# Patient Record
Sex: Male | Born: 1955 | Race: White | Hispanic: No | Smoking: Current some day smoker
Health system: Southern US, Community
[De-identification: ages and names within clinical notes are randomized; demographics above are authoritative.]

## PROBLEM LIST (undated history)

## (undated) DIAGNOSIS — I10 Essential (primary) hypertension: Secondary | ICD-10-CM

## (undated) DIAGNOSIS — T7840XA Allergy, unspecified, initial encounter: Secondary | ICD-10-CM

## (undated) HISTORY — PX: NASAL SEPTUM SURGERY: SHX37

## (undated) HISTORY — PX: TONSILLECTOMY: SUR1361

---

## 1998-05-22 ENCOUNTER — Other Ambulatory Visit: Admission: RE | Admit: 1998-05-22 | Discharge: 1998-05-22 | Payer: Self-pay | Admitting: Gastroenterology

## 2000-10-16 ENCOUNTER — Emergency Department (HOSPITAL_COMMUNITY): Admission: EM | Admit: 2000-10-16 | Discharge: 2000-10-17 | Payer: Self-pay

## 2013-10-11 ENCOUNTER — Observation Stay (HOSPITAL_COMMUNITY)
Admission: EM | Admit: 2013-10-11 | Discharge: 2013-10-13 | Disposition: A | Discharge status: Home / Self Care | Payer: BC Managed Care – PPO | Attending: Internal Medicine | Admitting: Internal Medicine

## 2013-10-11 ENCOUNTER — Emergency Department (HOSPITAL_COMMUNITY): Payer: BC Managed Care – PPO

## 2013-10-11 ENCOUNTER — Encounter (HOSPITAL_COMMUNITY): Payer: Self-pay | Admitting: Emergency Medicine

## 2013-10-11 DIAGNOSIS — H5500 Unspecified nystagmus: Secondary | ICD-10-CM | POA: Diagnosis present

## 2013-10-11 DIAGNOSIS — R42 Dizziness and giddiness: Principal | ICD-10-CM | POA: Diagnosis present

## 2013-10-11 DIAGNOSIS — R262 Difficulty in walking, not elsewhere classified: Secondary | ICD-10-CM | POA: Diagnosis present

## 2013-10-11 DIAGNOSIS — R112 Nausea with vomiting, unspecified: Secondary | ICD-10-CM | POA: Diagnosis present

## 2013-10-11 DIAGNOSIS — Z880 Allergy status to penicillin: Secondary | ICD-10-CM | POA: Insufficient documentation

## 2013-10-11 HISTORY — DX: Allergy, unspecified, initial encounter: T78.40XA

## 2013-10-11 HISTORY — DX: Essential (primary) hypertension: I10

## 2013-10-11 LAB — COMPREHENSIVE METABOLIC PANEL
ALT: 15 U/L (ref 0–53)
AST: 16 U/L (ref 0–37)
Albumin: 4.6 g/dL (ref 3.5–5.2)
Alkaline Phosphatase: 54 U/L (ref 39–117)
Anion gap: 16 — ABNORMAL HIGH (ref 5–15)
BUN: 14 mg/dL (ref 6–23)
CO2: 23 mEq/L (ref 19–32)
Calcium: 9.6 mg/dL (ref 8.4–10.5)
Chloride: 101 mEq/L (ref 96–112)
Creatinine, Ser: 0.81 mg/dL (ref 0.50–1.35)
GFR calc non Af Amer: >90 mL/min (ref >90)
Glucose, Bld: 111 mg/dL — ABNORMAL HIGH (ref 70–99)
POTASSIUM: 3.5 meq/L — AB (ref 3.7–5.3)
SODIUM: 140 meq/L (ref 137–147)
TOTAL PROTEIN: 7.7 g/dL (ref 6.0–8.3)
Total Bilirubin: 0.6 mg/dL (ref 0.3–1.2)

## 2013-10-11 LAB — CBC
HCT: 48.7 % (ref 39.0–52.0)
Hemoglobin: 16.7 g/dL (ref 13.0–17.0)
MCH: 30.6 pg (ref 26.0–34.0)
MCHC: 34.3 g/dL (ref 30.0–36.0)
MCV: 89.4 fL (ref 78.0–100.0)
PLATELETS: 150 10*3/uL (ref 150–400)
RBC: 5.45 MIL/uL (ref 4.22–5.81)
RDW: 14.1 % (ref 11.5–15.5)
WBC: 13.2 10*3/uL — ABNORMAL HIGH (ref 4.0–10.5)

## 2013-10-11 LAB — I-STAT TROPONIN, ED
Troponin i, poc: 0 ng/mL (ref 0.00–0.08)
Troponin i, poc: 0 ng/mL (ref 0.00–0.08)

## 2013-10-11 LAB — LIPASE, BLOOD: Lipase: 33 U/L (ref 11–59)

## 2013-10-11 MED ORDER — DIAZEPAM 5 MG PO TABS
5.0000 mg | ORAL_TABLET | Freq: Once | ORAL | Status: AC
  Administered 2013-10-11: 5 mg via ORAL
  Filled 2013-10-11: qty 1

## 2013-10-11 MED ORDER — ONDANSETRON HCL 4 MG/2ML IJ SOLN
INTRAMUSCULAR | Status: AC
  Filled 2013-10-11: qty 2

## 2013-10-11 MED ORDER — ONDANSETRON HCL 4 MG/2ML IJ SOLN
4.0000 mg | Freq: Once | INTRAMUSCULAR | Status: AC
  Administered 2013-10-11: 4 mg via INTRAVENOUS

## 2013-10-11 MED ORDER — SODIUM CHLORIDE 0.9 % IV BOLUS (SEPSIS)
1000.0000 mL | Freq: Once | INTRAVENOUS | Status: AC
  Administered 2013-10-11: 1000 mL via INTRAVENOUS

## 2013-10-11 MED ORDER — MECLIZINE HCL 25 MG PO TABS
25.0000 mg | ORAL_TABLET | Freq: Once | ORAL | Status: AC
  Administered 2013-10-11: 25 mg via ORAL
  Filled 2013-10-11: qty 1

## 2013-10-11 NOTE — ED Notes (Signed)
Initial Contact - pt A+Ox4, resting on stretcher, reports episode dizziness last night while at rest which self resolved and second episode dizziness approx 1630 today, unresolved.  Pt reports dizziness worse with position changes, denies hx of similar.  Pt reports mild nausea, no vomiting.  Reports generalized weakness.  Pt denies CP/SOB.  Speaking full/clear sentences, rr even/un-lab, lsctab.  Abd s/nt/nd.  Skin PWD.  MAEI.  NAD.

## 2013-10-11 NOTE — ED Notes (Signed)
Pt to radiology.

## 2013-10-11 NOTE — ED Provider Notes (Signed)
CSN: [id number]     Arrival date & time 10/11/13  1817 History   First MD Initiated Contact with Patient 10/11/13 1825     Chief Complaint  Patient presents with  . Dizziness     (Consider location/radiation/quality/duration/timing/severity/associated sxs/prior Treatment) Patient is a 58 y.o. male presenting with dizziness. The history is provided by the patient.  Dizziness Quality:  Lightheadedness Severity:  Moderate Onset quality:  Gradual Timing:  Intermittent Progression:  Worsening Chronicity:  New Context: physical activity (walking)   Relieved by:  Nothing Worsened by:  Nothing tried Ineffective treatments:  None tried Associated symptoms: no blood in stool, no chest pain, no shortness of breath and no vomiting     History reviewed. No pertinent past medical history. History reviewed. No pertinent past surgical history. No family history on file. History  Substance Use Topics  . Smoking status: Not on file  . Smokeless tobacco: Not on file  . Alcohol Use: Not on file    Review of Systems  Constitutional: Negative for fever.  Respiratory: Negative for cough and shortness of breath.   Cardiovascular: Negative for chest pain.  Gastrointestinal: Negative for vomiting, abdominal pain and blood in stool.  Neurological: Positive for dizziness.  All other systems reviewed and are negative.     Allergies  Penicillins  Home Medications   Prior to Admission medications   Not on File   BP 160/88  Pulse 58  Temp(Src) 97.7 F (36.5 C) (Oral)  Resp 16  SpO2 100% Physical Exam  Nursing note and vitals reviewed. Constitutional: He is oriented to person, place, and time. He appears well-developed and well-nourished. No distress.  HENT:  Head: Normocephalic and atraumatic.  Mouth/Throat: Oropharynx is clear and moist. No oropharyngeal exudate.  Eyes: EOM are normal. Pupils are equal, round, and reactive to light.  Neck: Normal range of motion. Neck supple.   Cardiovascular: Normal rate and regular rhythm.  Exam reveals no friction rub.   No murmur heard. Pulmonary/Chest: Effort normal and breath sounds normal. No respiratory distress. He has no wheezes. He has no rales.  Abdominal: Soft. He exhibits no distension. There is no tenderness. There is no rebound.  Musculoskeletal: Normal range of motion. He exhibits no edema.  Neurological: He is alert and oriented to person, place, and time. No cranial nerve deficit. He exhibits normal muscle tone. Coordination normal.  Skin: No rash noted. He is not diaphoretic.    ED Course  Procedures (including critical care time) Labs Review Labs Reviewed  CBC  COMPREHENSIVE METABOLIC PANEL  LIPASE, BLOOD  I-STAT TROPOININ, ED    Imaging Review Dg Chest 1 View  10/11/2013   CLINICAL DATA:  dizzy  EXAM: CHEST - 1 VIEW  COMPARISON:  None.  FINDINGS: The heart size and mediastinal contours are within normal limits. Both lungs are clear. Left lateral costophrenic angle excluded. Old healed left clavicle fracture.  IMPRESSION: No active disease.   Electronically Signed   By: Arne Cleveland M.D.   On: 10/11/2013 19:43   Ct Head Wo Contrast  10/11/2013   CLINICAL DATA:  DIZZINESS  EXAM: CT HEAD WITHOUT CONTRAST  TECHNIQUE: Contiguous axial images were obtained from the base of the skull through the vertex without intravenous contrast.  COMPARISON:  None.  FINDINGS: There is no evidence of acute intracranial hemorrhage, brain edema, mass lesion, acute infarction, mass effect, or midline shift. Acute infarct may be inapparent on noncontrast CT. No other intra-axial abnormalities are seen, and the ventricles and sulci  are within normal limits in size and symmetry. No abnormal extra-axial fluid collections or masses are identified. No significant calvarial abnormality.  IMPRESSION: Negative for bleed or other acute intracranial process.   Electronically Signed   By: Arne Cleveland M.D.   On: 10/11/2013 19:45   Mr  Brain Wo Contrast  10/11/2013   CLINICAL DATA:  New onset dizziness and nausea.  General weakness.  EXAM: MRI HEAD WITHOUT CONTRAST  TECHNIQUE: Multiplanar, multiecho pulse sequences of the brain and surrounding structures were obtained without intravenous contrast.  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  There is no acute infarct. Ventricles and sulci are normal for age. There is no evidence of intracranial hemorrhage, mass, midline shift, or extra-axial fluid collection. No brain parenchymal signal abnormality is identified.  Orbits are unremarkable. Paranasal sinuses and mastoid air cells are clear. Major intracranial vascular flow voids are preserved. Calvarium and scalp soft tissues are unremarkable.  IMPRESSION: Unremarkable appearance of the brain for age. No evidence of acute intracranial abnormality.   Electronically Signed   By: Logan Bores   On: 10/11/2013 21:28     EKG Interpretation   Date/Time:  Friday October 11 2013 18:22:11 EDT Ventricular Rate:  58 PR Interval:  153 QRS Duration: 95 QT Interval:  440 QTC Calculation: 432 R Axis:   52 Text Interpretation:  Sinus rhythm Left atrial enlargement No prior EKG  Confirmed by Mingo Amber  MD, Elya Tarquinio ([id number]) on 10/11/2013 7:00:10 PM      MDM   Final diagnoses:  Dizziness    58 year old male presents with dizziness. Happened last night for about a minute. It was self-limited after about a minute. Happened while he is walking around. He had not just sat up. Happened again today while working, was walking around. He became diaphoretic and began having nausea. Dizziness described as wooziness, lightheadedness. No room spinning sensation. He also felt unsteady on his feet. Dizziness has no alleviating or exacerbating factors.  On exam, vitals ok. He had an abrupt episode of N/V while we were talking. R beating nystagmus on exam. Denies diplopia, but states some mildly blurry  vision. Normal strength and sensation. Unable to walk due to his N/V. Will MR for possible stroke. Will also send other labs. EKG ok. Zofran and fluids given. MR ok. Labs ok. Serial troponins ok. Persistent dizziness with movement, given valium and meclizine. No change with meclizine/valium. Persistently weak and dizzy here. Will admit for further observation.  Osvaldo Shipper, MD 10/12/13 [id number]

## 2013-10-11 NOTE — ED Notes (Signed)
Bed: EC95 Expected date:  Expected time:  Means of arrival:  Comments: EMS-dizzy

## 2013-10-11 NOTE — H&P (Signed)
Mark Frank is an 58 y.o. male.   Chief Complaint: Dizziness, nausea and vomiting, and diaphoresis HPI: pt is a 58 yr old man who states that the problem of dizziness started on the evening on the 23rd while he was walking.  His daughter states that he took a slight blow to the head just before he started feeling dizzy, but the patient denies that the two are related.  He states that the dizziness was slight at that time and had completely resolved by the time he went to work the next morning.  At 1600 he was at work in his office.  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  He states that when he walked in there he was stumbling and couldn't get his balance.  When he got back to his desk and sat down he put his head down. He denies that the room was spinning, but he does say that he was nauseated and that he broke into a sweat.  He had his secretary call EMS and bring him to the ED.  He had emesis x 1 when he arrived in the ED.  Now the patient states that his symptoms are minimized by lying flat, but that he feels very woozy when he sits up.  He states that his symptoms are unchanged by moving his head from side to side.    History reviewed. No pertinent past medical history. Pt denies any PMHx of cardiac, respiratory, endocrine, GI, renal, or hepatic medical problems. He states that he sees a PCP on a regular basis and has lost a fair amount of weight intentionally in the last year. He was to discuss potentially initiating treatment for hypertension on his next visit.  History reviewed. No pertinent past surgical history. Denies any surgical history. No family history on file.Father with CAD and colon cancer. No family history of DM. Social History:  has no tobacco, alcohol, and drug history on file.  Allergies:  Allergies  Allergen Reactions  . Penicillins Hives and Rash     (Not in a hospital admission)  Results for orders placed during the hospital encounter  of 10/11/13 (from the past 48 hour(s))  CBC     Status: Abnormal   Collection Time    10/11/13  7:03 PM      Result Value Ref Range   WBC 13.2 (*) 4.0 - 10.5 K/uL   RBC 5.45  4.22 - 5.81 MIL/uL   Hemoglobin 16.7  13.0 - 17.0 g/dL   HCT 48.7  39.0 - 52.0 %   MCV 89.4  78.0 - 100.0 fL   MCH 30.6  26.0 - 34.0 pg   MCHC 34.3  30.0 - 36.0 g/dL   RDW 14.1  11.5 - 15.5 %   Platelets 150  150 - 400 K/uL  COMPREHENSIVE METABOLIC PANEL     Status: Abnormal   Collection Time    10/11/13  7:03 PM      Result Value Ref Range   Sodium 140  137 - 147 mEq/L   Potassium 3.5 (*) 3.7 - 5.3 mEq/L   Chloride 101  96 - 112 mEq/L   CO2 23  19 - 32 mEq/L   Glucose, Bld 111 (*) 70 - 99 mg/dL   BUN 14  6 - 23 mg/dL   Creatinine, Ser 0.81  0.50 - 1.35 mg/dL   Calcium 9.6  8.4 - 10.5 mg/dL   Total Protein 7.7  6.0 - 8.3  g/dL   Albumin 4.6  3.5 - 5.2 g/dL   AST 16  0 - 37 U/L   ALT 15  0 - 53 U/L   Alkaline Phosphatase 54  39 - 117 U/L   Total Bilirubin 0.6  0.3 - 1.2 mg/dL   GFR calc non Af Amer >90  >90 mL/min   GFR calc Af Amer >90  >90 mL/min   Comment: (NOTE)     The eGFR has been calculated using the CKD EPI equation.     This calculation has not been validated in all clinical situations.     eGFR's persistently <90 mL/min signify possible Chronic Kidney     Disease.   Anion gap 16 (*) 5 - 15  LIPASE, BLOOD     Status: None   Collection Time    10/11/13  7:03 PM      Result Value Ref Range   Lipase 33  11 - 59 U/L  I-STAT TROPOININ, ED     Status: None   Collection Time    10/11/13  7:12 PM      Result Value Ref Range   Troponin i, poc 0.00  0.00 - 0.08 ng/mL   Comment 3            Comment: Due to the release kinetics of cTnI,     a negative result within the first hours     of the onset of symptoms does not rule out     myocardial infarction with certainty.     If myocardial infarction is still suspected,     repeat the test at appropriate intervals.  Randolm Idol, ED      Status: None   Collection Time    10/11/13 11:11 PM      Result Value Ref Range   Troponin i, poc 0.00  0.00 - 0.08 ng/mL   Comment 3            Comment: Due to the release kinetics of cTnI,     a negative result within the first hours     of the onset of symptoms does not rule out     myocardial infarction with certainty.     If myocardial infarction is still suspected,     repeat the test at appropriate intervals.   Dg Chest 1 View  10/11/2013   CLINICAL DATA:  dizzy  EXAM: CHEST - 1 VIEW  COMPARISON:  None.  FINDINGS: The heart size and mediastinal contours are within normal limits. Both lungs are clear. Left lateral costophrenic angle excluded. Old healed left clavicle fracture.  IMPRESSION: No active disease.   Electronically Signed   By: Arne Cleveland M.D.   On: 10/11/2013 19:43   Ct Head Wo Contrast  10/11/2013   CLINICAL DATA:  DIZZINESS  EXAM: CT HEAD WITHOUT CONTRAST  TECHNIQUE: Contiguous axial images were obtained from the base of the skull through the vertex without intravenous contrast.  COMPARISON:  None.  FINDINGS: There is no evidence of acute intracranial hemorrhage, brain edema, mass lesion, acute infarction, mass effect, or midline shift. Acute infarct may be inapparent on noncontrast CT. No other intra-axial abnormalities are seen, and the ventricles and sulci are within normal limits in size and symmetry. No abnormal extra-axial fluid collections or masses are identified. No significant calvarial abnormality.  IMPRESSION: Negative for bleed or other acute intracranial process.   Electronically Signed   By: Arne Cleveland M.D.   On: 10/11/2013 19:45  Mr Brain Wo Contrast  10/11/2013   CLINICAL DATA:  New onset dizziness and nausea.  General weakness.  EXAM: MRI HEAD WITHOUT CONTRAST  TECHNIQUE: Multiplanar, multiecho pulse sequences of the brain and surrounding structures were obtained without intravenous contrast.  COMPARISON:  Head CT 10/11/2013  FINDINGS: Small left  frontal scalp lipoma is incidentally noted.  There is no acute infarct. Ventricles and sulci are normal for age. There is no evidence of intracranial hemorrhage, mass, midline shift, or extra-axial fluid collection. No brain parenchymal signal abnormality is identified.  Orbits are unremarkable. Paranasal sinuses and mastoid air cells are clear. Major intracranial vascular flow voids are preserved. Calvarium and scalp soft tissues are unremarkable.  IMPRESSION: Unremarkable appearance of the brain for age. No evidence of acute intracranial abnormality.   Electronically Signed   By: Logan Bores   On: 10/11/2013 21:28    Review of Systems  Constitutional: Positive for weight loss and diaphoresis. Negative for fever.       Intentional weight loss.  HENT: Positive for congestion. Negative for ear discharge, ear pain, hearing loss, sore throat and tinnitus.        Chronic nasal congestion.  Eyes: Negative for blurred vision, double vision and redness.  Respiratory: Negative for cough, hemoptysis, sputum production, shortness of breath, wheezing and stridor.   Cardiovascular: Negative for chest pain, palpitations, orthopnea, claudication, leg swelling and PND.  Gastrointestinal: Positive for nausea and vomiting. Negative for heartburn, abdominal pain, diarrhea and constipation.  Genitourinary: Negative for dysuria, frequency and flank pain.  Musculoskeletal: Negative for back pain, falls, joint pain, myalgias and neck pain.  Neurological: Positive for dizziness. Negative for tingling, tremors, sensory change, speech change, focal weakness, seizures, loss of consciousness, weakness and headaches.  Endo/Heme/Allergies: Negative for environmental allergies. Does not bruise/bleed easily.  Psychiatric/Behavioral: Negative for depression, memory loss and substance abuse. The patient does not have insomnia.     Blood pressure 155/107, pulse 58, temperature 97.7 F (36.5 C), temperature source Oral, resp.  rate 20, SpO2 100.00%. Physical Exam  Constitutional: He is oriented to person, place, and time. He appears well-developed and well-nourished.  Pt is awake, alert, and oriented x 3.  He states that he did have severe diaphoresis earlier in the afternoon in his office.  HENT:  Head: Normocephalic and atraumatic.  Mouth/Throat: Oropharynx is clear and moist. No oropharyngeal exudate.  Eyes: EOM are normal. Pupils are equal, round, and reactive to light. Right eye exhibits no discharge. Left eye exhibits no discharge. No scleral icterus.  Positive for left beat nystagmus while lying flat with minimal symptoms.  Neck: No JVD present. No tracheal deviation present. No thyromegaly present.  Cardiovascular: Normal rate, regular rhythm and normal heart sounds.  Exam reveals no gallop and no friction rub.   No murmur heard. Respiratory: Effort normal and breath sounds normal. No respiratory distress. He has no rales. He exhibits no tenderness.  GI: He exhibits no distension and no mass. There is no tenderness. There is no rebound and no guarding.  Musculoskeletal: Normal range of motion. He exhibits no edema and no tenderness.  Lymphadenopathy:    He has no cervical adenopathy.  Neurological: He is alert and oriented to person, place, and time. He has normal reflexes. He displays normal reflexes. No cranial nerve deficit. He exhibits normal muscle tone.  Skin: Skin is warm and dry. No erythema. No pallor.  Psychiatric: He has a normal mood and affect. Judgment and thought content normal.  Assessment/Plan 1.Dizziness. Stumbling gate, "wooziness", feeling as if he will fall over. Worse with sitting up, standing, or walking. MRI of the head is negative for acute changes. Pt will be admitted to observation status.  He will have a neurological consult in the morning as well as a PT consult.  It is possible that the patient is suffering from benign positional vertigo, but his symptoms and the continual  presence of nystagmus are not typical. I will given him prn valium for his symptoms for now. 2. Hypertension - will start pt on lisinopril 3. Nausea and vomiting - antiemetics. 4. Pt is unable to walk due to dizziness. - PT eval and treat.   Tran Randle 10/11/2013, 11:53 PM

## 2013-10-11 NOTE — ED Notes (Signed)
Pt states he doesn't have any pain only woozie and dizziness when he sits up.

## 2013-10-11 NOTE — ED Notes (Signed)
Pt actively vomiting, Dr. Mingo Amber at bedside, verbal orders rec'd and pt med per [facility].

## 2013-10-11 NOTE — ED Notes (Signed)
PER EMS - pt c/o dizziness intermittently over the last week, pt reports stressors at work, mild nausea, denies other complaints.  A+Ox4.  Skin PWD.  -orthos.

## 2013-10-12 ENCOUNTER — Encounter (HOSPITAL_COMMUNITY): Payer: Self-pay | Admitting: *Deleted

## 2013-10-12 DIAGNOSIS — R262 Difficulty in walking, not elsewhere classified: Secondary | ICD-10-CM | POA: Diagnosis present

## 2013-10-12 DIAGNOSIS — R112 Nausea with vomiting, unspecified: Secondary | ICD-10-CM | POA: Diagnosis present

## 2013-10-12 DIAGNOSIS — R42 Dizziness and giddiness: Secondary | ICD-10-CM | POA: Diagnosis present

## 2013-10-12 DIAGNOSIS — H5500 Unspecified nystagmus: Secondary | ICD-10-CM | POA: Diagnosis present

## 2013-10-12 LAB — CBC
HEMATOCRIT: 47.9 % (ref 39.0–52.0)
HEMOGLOBIN: 16.2 g/dL (ref 13.0–17.0)
MCH: 30.6 pg (ref 26.0–34.0)
MCHC: 33.8 g/dL (ref 30.0–36.0)
MCV: 90.4 fL (ref 78.0–100.0)
Platelets: 154 10*3/uL (ref 150–400)
RBC: 5.3 MIL/uL (ref 4.22–5.81)
RDW: 14 % (ref 11.5–15.5)
WBC: 10.2 10*3/uL (ref 4.0–10.5)

## 2013-10-12 LAB — COMPREHENSIVE METABOLIC PANEL
ALT: 14 U/L (ref 0–53)
AST: 14 U/L (ref 0–37)
Albumin: 4 g/dL (ref 3.5–5.2)
Alkaline Phosphatase: 51 U/L (ref 39–117)
Anion gap: 12 (ref 5–15)
BILIRUBIN TOTAL: 0.7 mg/dL (ref 0.3–1.2)
BUN: 12 mg/dL (ref 6–23)
CALCIUM: 9.2 mg/dL (ref 8.4–10.5)
CHLORIDE: 103 meq/L (ref 96–112)
CO2: 25 meq/L (ref 19–32)
CREATININE: 0.83 mg/dL (ref 0.50–1.35)
GFR calc non Af Amer: >90 mL/min (ref >90)
Glucose, Bld: 126 mg/dL — ABNORMAL HIGH (ref 70–99)
Potassium: 3.8 mEq/L (ref 3.7–5.3)
Sodium: 140 mEq/L (ref 137–147)
Total Protein: 6.8 g/dL (ref 6.0–8.3)

## 2013-10-12 MED ORDER — SODIUM CHLORIDE 0.9 % IV SOLN: 250.0000 mL | INTRAVENOUS | Status: DC | PRN

## 2013-10-12 MED ORDER — LISINOPRIL 10 MG PO TABS
10.0000 mg | ORAL_TABLET | Freq: Every day | ORAL | Status: DC
  Administered 2013-10-12 – 2013-10-13 (×2): 10 mg via ORAL
  Filled 2013-10-12 (×2): qty 1

## 2013-10-12 MED ORDER — ACETAMINOPHEN 650 MG RE SUPP: 650.0000 mg | Freq: Four times a day (QID) | RECTAL | Status: DC | PRN

## 2013-10-12 MED ORDER — DIAZEPAM 2 MG PO TABS: 2.0000 mg | ORAL_TABLET | Freq: Four times a day (QID) | ORAL | Status: DC | PRN

## 2013-10-12 MED ORDER — LORATADINE 10 MG PO TABS
10.0000 mg | ORAL_TABLET | Freq: Every day | ORAL | Status: DC
  Administered 2013-10-12 – 2013-10-13 (×2): 10 mg via ORAL
  Filled 2013-10-12 (×2): qty 1

## 2013-10-12 MED ORDER — OMEGA-3-ACID ETHYL ESTERS 1 G PO CAPS
2.0000 g | ORAL_CAPSULE | Freq: Every day | ORAL | Status: DC
  Administered 2013-10-12 – 2013-10-13 (×2): 2 g via ORAL
  Filled 2013-10-12 (×2): qty 2

## 2013-10-12 MED ORDER — ASPIRIN 81 MG PO CHEW
81.0000 mg | CHEWABLE_TABLET | Freq: Every day | ORAL | Status: DC
  Administered 2013-10-12 – 2013-10-13 (×2): 81 mg via ORAL
  Filled 2013-10-12 (×2): qty 1

## 2013-10-12 MED ORDER — ACETAMINOPHEN 325 MG PO TABS: 650.0000 mg | ORAL_TABLET | Freq: Four times a day (QID) | ORAL | Status: DC | PRN

## 2013-10-12 MED ORDER — ENOXAPARIN SODIUM 40 MG/0.4ML ~~LOC~~ SOLN
40.0000 mg | SUBCUTANEOUS | Status: DC
  Administered 2013-10-12: 40 mg via SUBCUTANEOUS
  Filled 2013-10-12 (×2): qty 0.4

## 2013-10-12 MED ORDER — MECLIZINE HCL 25 MG PO TABS
25.0000 mg | ORAL_TABLET | Freq: Three times a day (TID) | ORAL | Status: DC | PRN
  Administered 2013-10-13: 25 mg via ORAL
  Filled 2013-10-12 (×2): qty 1

## 2013-10-12 MED ORDER — SODIUM CHLORIDE 0.9 % IJ SOLN: 3.0000 mL | INTRAMUSCULAR | Status: DC | PRN

## 2013-10-12 MED ORDER — SODIUM CHLORIDE 0.9 % IJ SOLN
3.0000 mL | Freq: Two times a day (BID) | INTRAMUSCULAR | Status: DC
  Administered 2013-10-12 (×2): 3 mL via INTRAVENOUS

## 2013-10-12 NOTE — Plan of Care (Signed)
Problem: Phase I Progression Outcomes Goal: Hemodynamically stable Outcome: Progressing To start antihypertensives in am

## 2013-10-12 NOTE — Progress Notes (Signed)
Patient admitted from ED with dizziness to room 1337. Oriented to room and unit. Patient remains dizzy with nausea sensation when rising from lying position to sit or stand. Bed alarm on. Patient instructed to call for assist.

## 2013-10-12 NOTE — Plan of Care (Signed)
Problem: Phase I Progression Outcomes Goal: Pain controlled with appropriate interventions Outcome: Not Applicable Date Met:  70/78/67 Denies pain

## 2013-10-12 NOTE — Progress Notes (Signed)
Report obtained from Vickii Penna in ED.

## 2013-10-12 NOTE — Evaluation (Addendum)
Physical Therapy Evaluation Patient Details Name: Mark Frank MRN: [id number] DOB: 07/18/1955 Today's Date: 10/12/2013   History of Present Illness  Patient is a 58 yo male admitted 10/11/13 with dizziness, N/V, and imbalance.  PMH: HTN, Allergies  Clinical Impression  Patient presents with problems listed below.  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  Provided patient with exercise program.  Also instructed patient on compensation technique for imbalance during gait.  Recommend f/u OP PT for vestibular rehab at discharge (patient will need a prescription for this).  Will continue to work with patient while inpatient as well.    Follow Up Recommendations Outpatient PT for Vestibular Rehab;Supervision - Intermittent    Equipment Recommendations  None recommended by PT    Recommendations for Other Services       Precautions / Restrictions Precautions Precautions: Fall Restrictions Weight Bearing Restrictions: No      Mobility  Bed Mobility Overal bed mobility: Independent                Transfers Overall transfer level: Needs assistance Equipment used: None Transfers: Sit to/from Stand Sit to Stand: Supervision         General transfer comment: Supervision for safety/balance only.  Ambulation/Gait Ambulation/Gait assistance: Min guard Ambulation Distance (Feet): 250 Feet Assistive device: None Gait Pattern/deviations: Step-through pattern;Staggering left;Staggering right;Drifts right/left Gait velocity: WFL Gait velocity interpretation: at or above normal speed for age/gender General Gait Details: Patient with staggering gait to Rt and Lt.  Assist to maintain balance.  Instructed patient to use compensation technique of focusing on a target during gait.  Did note some improvement with less loss of balance.  Stairs            Wheelchair Mobility    Modified Rankin  (Stroke Patients Only)       Balance Overall balance assessment: Needs assistance         Standing balance support: No upper extremity supported;During functional activity Standing balance-Leahy Scale: Fair               High level balance activites: Direction changes;Turns;Sudden stops;Head turns High Level Balance Comments: Noted decrease in balance with high level balance activities.             Pertinent Vitals/Pain     Home Living Family/patient expects to be discharged to:: Private residence Living Arrangements: Alone Available Help at Discharge: Family;Friend(s);Available PRN/intermittently (Daughters, girlfriend) Type of Home: House         Home Equipment: None      Prior Function Level of Independence: Independent         Comments: Very active     Hand Dominance        Extremity/Trunk Assessment   Upper Extremity Assessment: Overall WFL for tasks assessed           Lower Extremity Assessment: Overall WFL for tasks assessed      Cervical / Trunk Assessment: Normal  Communication   Communication: No difficulties  Cognition Arousal/Alertness: Awake/alert Behavior During Therapy: WFL for tasks assessed/performed Overall Cognitive Status: Within Functional Limits for tasks assessed                      General Comments      Vestibular Eval: *BPPV testing:  Dix-Hallpike tested negative to Rt and Lt.  Roll test was negative to Rt and Lt. *Oculomotor:  Noted spontaneous right beating nystagmus, present in all directions  and stronger with right gaze.  Smooth pursuits and saccades appeared normal - difficulty testing with nystagmus present.  VOR tested normal  Noted positive head thrust to left. *Exercises:  Instructed patient to do x1 exercises in sitting both horizontally and vertically for up to 60 seconds, 3 times each.  Patient to complete these exercises 3x/day. *Compensation technique: Focus on target with mobility and  gait.  Instructed patient on using technique during turns.      Assessment/Plan    PT Assessment Patient needs continued PT services  PT Diagnosis Abnormality of gait   PT Problem List Decreased balance;Decreased mobility;Other (comment) (Dizziness)  PT Treatment Interventions Gait training;Functional mobility training;Therapeutic activities;Therapeutic exercise;Balance training;Patient/family education (Vestibular Rehab)   PT Goals (Current goals can be found in the Care Plan section) Acute Rehab PT Goals Patient Stated Goal: To be able to return home soon PT Goal Formulation: With patient Time For Goal Achievement: 10/19/13 Potential to Achieve Goals: Good    Frequency Min 4X/week   Barriers to discharge Decreased caregiver support Lives alone    Co-evaluation               End of Session   Activity Tolerance: Patient tolerated treatment well Patient left: in bed;with call bell/phone within reach Nurse Communication: Mobility status (Need for f/u OP PT)    Functional Assessment Tool Used: Clinical judgement Functional Limitation: Mobility: Walking and moving around Mobility: Walking and Moving Around Current Status ([id number]): At least 20 percent but less than 40 percent impaired, limited or restricted Mobility: Walking and Moving Around Goal Status [phone]): 0 percent impaired, limited or restricted    Time: 5638-7564 PT Time Calculation (min): 57 min   Charges:   PT Evaluation $Initial PT Evaluation Tier I: 1 Procedure PT Treatments $Gait Training: 8-22 mins $Therapeutic Activity: 23-37 mins   PT G Codes:   Functional Assessment Tool Used: Clinical judgement Functional Limitation: Mobility: Walking and moving around    Despina Pole 10/12/2013, 6:19 PM Carita Pian. Sanjuana Kava, [place] Pager [phone]

## 2013-10-12 NOTE — Consult Note (Signed)
Reason for Consult: Dizziness Referring Physician: Dr Olen Pel  CC: Dizziness  HPI: Mark Frank is a very pleasant 58 y.o. male admitted to [facility] yesterday for evaluation of dizziness. Several days ago the patient was walking and talking with his daughter when he suddenly became very dizzy. He stopped walking and leaned against a car. The episode passed in about 45 seconds. Yesterday he was at work and around 4 PM he got up from his desk to speak with an Environmental consultant. He once again became very dizzy, off balance, and nauseated. He sat down in a chair and his coworker told him he was cold and clammy. He was also nauseated and later vomited. EMS was summoned and the patient was brought to the emergency department where he was evaluated. A CT scan of the head and an MRI with both negative for acute changes. An MRA was not performed. The patient felt fine lying on a stretcher with his eyes closed but each time he sat up or stood the dizziness returned. He was admitted for further evaluation.  The patient believes he may or may not have had some blurred vision associated with this episode. He was fairly anxious. He denied chest pain, shortness of breath, headache, or tachycardia. His blood pressure was elevated on admission with documented pressures of 155/107 and 153/91. Recently the patient has been concerned about his blood pressure running high and he had intended to speak with his personal physician about medical therapy.  The patient is very active and works out 3-4 times per week with a Physiological scientist. 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 Today he feels much better; however, he is still mildly dizzy when standing. He lives alone. He has been on aspirin 81 mg daily. Lisinopril is been added for hypertension.   Past Medical History  Diagnosis Date  .  Allergy   . Hypertension     Past Surgical History  Procedure Laterality Date  . Nasal septum surgery    . Tonsillectomy      Family History  Problem Relation Age of Onset  . Cancer - Colon Father    His father died at age 2. He had hypertension. His mother is alive at age 45 with multiple medical problems.  Social History:  reports that he has been smoking Cigarettes.  He has been smoking about 0.00 packs per day for the past 0 years. He does not have any smokeless tobacco history on file. He reports that he drinks alcohol. He reports that he does not use illicit drugs.  Allergies  Allergen Reactions  . Penicillins Hives and Rash    Medications:  Scheduled: . aspirin  81 mg Oral Daily  . enoxaparin (LOVENOX) injection  40 mg Subcutaneous Q24H  . lisinopril  10 mg Oral Daily  . loratadine  10 mg Oral Daily  . omega-3 acid ethyl esters  2 g Oral Daily  . sodium chloride  3 mL Intravenous Q12H    ROS: History obtained from the patient  General ROS: negative for - chills, fatigue, fever, night sweats, weight gain. Intentional 50 pound weight loss over the past 3 years through diet and exercise.  Psychological ROS: negative for - behavioral disorder, hallucinations, memory difficulties, mood swings or suicidal ideation. Positive for increased stress. Ophthalmic ROS: negative for - blurry vision, double vision,  eye pain or loss of vision ENT ROS: negative for - epistaxis, nasal discharge, oral lesions, sore throat. Positive for mild tinnitus and recent fitting for hearing aids. Allergy and Immunology ROS: negative for - hives or itchy/watery eyes Hematological and Lymphatic ROS: negative for - bleeding problems, bruising or swollen lymph nodes Endocrine ROS: negative for - galactorrhea, hair pattern changes, polydipsia/polyuria or temperature intolerance Respiratory ROS: negative for - cough, hemoptysis, shortness of breath or wheezing Cardiovascular ROS: negative for - chest  pain, dyspnea on exertion, edema or irregular heartbeat Gastrointestinal ROS: negative for - abdominal pain, diarrhea, hematemesis, nausea/vomiting or stool incontinence Genito-Urinary ROS: negative for - dysuria, hematuria, incontinence or urinary frequency/urgency Musculoskeletal ROS: negative for - joint swelling or muscular weakness Neurological ROS: as noted in HPI Dermatological ROS: negative for rash and skin lesion changes   Physical Examination: Blood pressure 147/90, pulse 69, temperature 98.3 F (36.8 C), temperature source Oral, resp. rate 18, height 5\' 11"  (1.803 m), weight 207 lb 9.6 oz (94.167 kg), SpO2 99.00%.  Neurologic Examination Mental Status: Alert, oriented, thought content appropriate.  Speech fluent without evidence of aphasia.  Able to follow 3 step commands without difficulty. Cranial Nerves: II: Discs flat bilaterally; Visual fields grossly normal, pupils equal, round, reactive to light and accommodation III,IV, VI: ptosis not present, extra-ocular motions intact bilaterally V,VII: smile symmetric, facial light touch sensation normal bilaterally VIII: hearing normal bilaterally IX,X: gag reflex present XI: bilateral shoulder shrug XII: midline tongue extension Motor: Right : Upper extremity   5/5    Left:     Upper extremity   5/5  Lower extremity   5/5     Lower extremity   5/5 Tone and bulk:normal tone throughout; no atrophy noted Sensory: Pinprick and light touch intact throughout, bilaterally Deep Tendon Reflexes: 2+ and symmetric throughout Plantars: Right: downgoing   Left: downgoing Cerebellar: normal finger-to-nose, normal rapid alternating movements and normal heel-to-shin test Gait: Mildly unsteady on his feet.   Laboratory Studies:   Basic Metabolic Panel:  Recent Labs Lab 10/11/13 1903 10/12/13 0411  NA 140 140  K 3.5* 3.8  CL 101 103  CO2 23 25  GLUCOSE 111* 126*  BUN 14 12  CREATININE 0.81 0.83  CALCIUM 9.6 9.2    Liver  Function Tests:  Recent Labs Lab 10/11/13 1903 10/12/13 0411  AST 16 14  ALT 15 14  ALKPHOS 54 51  BILITOT 0.6 0.7  PROT 7.7 6.8  ALBUMIN 4.6 4.0    Recent Labs Lab 10/11/13 1903  LIPASE 33   No results found for this basename: AMMONIA,  in the last 168 hours  CBC:  Recent Labs Lab 10/11/13 1903 10/12/13 0411  WBC 13.2* 10.2  HGB 16.7 16.2  HCT 48.7 47.9  MCV 89.4 90.4  PLT 150 154    Cardiac Enzymes: No results found for this basename: CKTOTAL, CKMB, CKMBINDEX, TROPONINI,  in the last 168 hours  BNP: No components found with this basename: POCBNP,   CBG: No results found for this basename: GLUCAP,  in the last 168 hours  Microbiology: No results found for this or any previous visit.  Coagulation Studies: No results found for this basename: LABPROT, INR,  in the last 72 hours  Urinalysis: No results found for this basename: COLORURINE, APPERANCEUR, LABSPEC, PHURINE, GLUCOSEU, HGBUR, BILIRUBINUR, KETONESUR, PROTEINUR, UROBILINOGEN, NITRITE, LEUKOCYTESUR,  in the last 168 hours  Lipid Panel:  No results found for this basename: chol, trig, hdl, cholhdl, vldl, ldlcalc  HgbA1C:  No results found for this basename: HGBA1C    Urine Drug Screen:   No results found for this basename: labopia, cocainscrnur, labbenz, amphetmu, thcu, labbarb    Alcohol Level: No results found for this basename: ETH,  in the last 168 hours  Other results: EKG: SR rate 58 beats per minute. Please refer to the formal reading for complete details.  Imaging:  Dg Chest 1 View 10/11/2013    No active disease.    Ct Head Wo Contrast 10/11/2013     Negative for bleed or other acute intracranial process.    Mr Brain Wo Contrast 10/11/2013     Unremarkable appearance of the brain for age. No evidence of acute intracranial abnormality.         Assessment/Plan:  58 year old man with likely benign positional vertigo. Patient has no focal deficits on examination and MRI  studies showed no signs of acute stroke or other posterior fossa pathology.   Recommendations: 1. Continue meclizine and diazepam as needed for management of vertigo symptomatically. 2. Physical therapy consultation for vestibular training and management. 3. No further neurodiagnostic studies are indicated.  Mikey Bussing PA-C [facility] Pager [phone] 10/12/2013, 12:56 PM  I personally participated in this patient's evaluation and management, including formulating above clinical impression and management recommendations.  Rush Farmer M.D. [facility] [phone]

## 2013-10-12 NOTE — Progress Notes (Addendum)
Patient ID: Mark Frank, male   DOB: 03/09/1956, 58 y.o.   MRN: [id number]  [facility] PROGRESS NOTE  Mark Frank QBV:[id number] DOB: 10-26-1955 DOA: 10/11/2013 PCP:  Melinda Crutch, MD  Brief narrative: 58 yr old man presents with dizziness started on the evening on the 23rd while he was walking. His daughter states that he took a slight blow to the head just before he started feeling dizzy, but the patient denies that the two are related. He states that the dizziness was slight at that time and had completely resolved by the time he went to work the next morning. At 1600 he was at work in his office. 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 He states that when he walked in there he was stumbling and couldn't get his balance. When he got back to his desk and sat down he put his head down. He denies that the room was spinning, but he does say that he was nauseated and that he broke into a sweat. He had his secretary call EMS and bring him to the ED. He had emesis x 1 when he arrived in the ED. Now the patient states that his symptoms are minimized by lying flat, but that he feels very woozy when he sits up. He states that his symptoms are unchanged by moving his head from side to side.   Active Problems:   Dizziness of unknown cause - ambulating in the room with me but feels still dizzy - will ask neurology for further input - PT/vestibular therapy - ambulate as pt able to tolerate - provide meclizine as needed  - avoid Valium, pt has been getting 2 mg valium and could be provoking dizziness   HTN - continue Lisinopril   Consultants:  Neurology   Procedures/Studies: Dg Chest 1 View  10/11/2013  No active disease.    Ct Head Wo Contrast  10/11/2013   Negative for bleed or other acute intracranial process.   Mr Brain Wo Contrast  10/11/2013 Unremarkable appearance of the brain for age. No evidence of acute intracranial abnormality.    Antibiotics:  None    Code Status: Full Family Communication: Pt at bedside Disposition Plan: Home when medically stable  HPI/Subjective: No events overnight.   Objective: Filed Vitals:   10/12/13 0211 10/12/13 0214 10/12/13 0217 10/12/13 0431  BP: 143/88 153/91 141/95 147/90  Pulse: 51 63 73 69  Temp:    98.3 F (36.8 C)  TempSrc:    Oral  Resp:    18  Height:      Weight:      SpO2:    99%    Intake/Output Summary (Last 24 hours) at 10/12/13 1109 Last data filed at 10/12/13 1026  Gross per 24 hour  Intake    113 ml  Output      0 ml  Net    113 ml    Exam:   General:  Pt is alert, follows commands appropriately, not in acute distress  Cardiovascular: Regular rate and rhythm, S1/S2, no murmurs, no rubs, no gallops  Respiratory: Clear to auscultation bilaterally, no wheezing, no crackles, no rhonchi  Abdomen: Soft, non tender, non distended, bowel sounds present, no guarding  Extremities: No edema, pulses DP and PT palpable bilaterally  Neuro: Grossly nonfocal  Data Reviewed: Basic Metabolic Panel:  Recent Labs Lab 10/11/13 1903 10/12/13 0411  NA 140 140  K 3.5* 3.8  CL 101 103  CO2 23  25  GLUCOSE 111* 126*  BUN 14 12  CREATININE 0.81 0.83  CALCIUM 9.6 9.2   Liver Function Tests:  Recent Labs Lab 10/11/13 1903 10/12/13 0411  AST 16 14  ALT 15 14  ALKPHOS 54 51  BILITOT 0.6 0.7  PROT 7.7 6.8  ALBUMIN 4.6 4.0    Recent Labs Lab 10/11/13 1903  LIPASE 33   CBC:  Recent Labs Lab 10/11/13 1903 10/12/13 0411  WBC 13.2* 10.2  HGB 16.7 16.2  HCT 48.7 47.9  MCV 89.4 90.4  PLT 150 154   Scheduled Meds: . aspirin  81 mg Oral Daily  . enoxaparin (LOVENOX) injection  40 mg Subcutaneous Q24H  . lisinopril  10 mg Oral Daily  . loratadine  10 mg Oral Daily  . omega-3 acid ethyl esters  2 g Oral Daily  . sodium chloride  3 mL Intravenous Q12H   Continuous Infusions:  Faye Ramsay, MD  [facility] Pager [phone]  If 7PM-7AM, please contact  night-coverage [email or web address] Password [email or web address] 10/12/2013, 11:09 AM   LOS: 1 day

## 2013-10-13 MED ORDER — LISINOPRIL 10 MG PO TABS: 10.0000 mg | ORAL_TABLET | Freq: Every day | ORAL | Status: DC

## 2013-10-13 MED ORDER — MECLIZINE HCL 25 MG PO TABS: 25.0000 mg | ORAL_TABLET | Freq: Three times a day (TID) | ORAL | Status: DC | PRN

## 2013-10-13 NOTE — Progress Notes (Signed)
Patient discharged home, all discharge medications and instructions reviewed and questions answered. Patient to be assisted to vehicle by wheelchair.  

## 2013-10-13 NOTE — Discharge Instructions (Signed)

## 2013-10-13 NOTE — Discharge Summary (Signed)
Physician Discharge Summary  Mark Frank IOX:[id number] DOB: [birth year] DOA: 10/11/2013  PCP:  Melinda Crutch, MD  Admit date: 10/11/2013 Discharge date: 10/13/2013  Recommendations for Outpatient Follow-up:  1. Pt will need to follow up with PCP in 2-3 weeks post discharge 2. Please obtain BMP to evaluate electrolytes and kidney function 3. Please also check CBC to evaluate Hg and Hct levels 4. Pt started on lisinopril for HTN  Discharge Diagnoses:  Active Problems:   Dizziness of unknown cause   Dizziness and giddiness   Nausea and vomiting in adult   Nystagmus   Unable to walk   Discharge Condition: Stable  Diet recommendation: Heart healthy diet discussed in details   Brief narrative:  58 yr old man presents with dizziness started on the evening on the 23rd while he was walking. His daughter states that he took a slight blow to the head just before he started feeling dizzy, but the patient denies that the two are related. He states that the dizziness was slight at that time and had completely resolved by the time he went to work the next morning. At 1600 he was at work in his office. 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 He states that when he walked in there he was stumbling and couldn't get his balance. When he got back to his desk and sat down he put his head down. He denies that the room was spinning, but he does say that he was nauseated and that he broke into a sweat. He had his secretary call EMS and bring him to the ED. He had emesis x 1 when he arrived in the ED. Now the patient states that his symptoms are minimized by lying flat, but that he feels very woozy when he sits up. He states that his symptoms are unchanged by moving his head from side to side.   Active Problems:  Dizziness of unknown cause  - ambulating in the room with me but feels still dizzy  - continue meclizine per neurology  - PT/vestibular therapy  - ambulate as pt able  to tolerate  HTN  - continue Lisinopril   Consultants:  Neurology  Procedures/Studies:  Dg Chest 1 View 10/11/2013 No active disease.  Ct Head Wo Contrast 10/11/2013 Negative for bleed or other acute intracranial process.  Mr Brain Wo Contrast 10/11/2013 Unremarkable appearance of the brain for age. No evidence of acute intracranial abnormality.  Antibiotics:  None   Code Status: Full  Family Communication: Pt at bedside  Disposition Plan: Home  Discharge Exam: Filed Vitals:   10/13/13 0515  BP: 143/85  Pulse: 53  Temp: 97.4 F (36.3 C)  Resp: 16   Filed Vitals:   10/12/13 0431 10/12/13 1505 10/12/13 2013 10/13/13 0515  BP: 147/90 146/84 137/74 143/85  Pulse: 69 67 60 53  Temp: 98.3 F (36.8 C) 98.3 F (36.8 C) 97.7 F (36.5 C) 97.4 F (36.3 C)  TempSrc: Oral Oral Oral Oral  Resp: 18 18 16 16   Height:      Weight:      SpO2: 99% 99% 98% 98%    General: Pt is alert, follows commands appropriately, not in acute distress Cardiovascular: Regular rate and rhythm, S1/S2 +, no murmurs, no rubs, no gallops Respiratory: Clear to auscultation bilaterally, no wheezing, no crackles, no rhonchi Abdominal: Soft, non tender, non distended, bowel sounds +, no guarding Extremities: no edema, no cyanosis, pulses palpable bilaterally DP and PT Neuro:  Grossly nonfocal  Discharge Instructions     Medication List         aspirin 81 MG chewable tablet  Chew 81 mg by mouth daily.     ibuprofen 200 MG tablet  Commonly known as:  ADVIL,MOTRIN  Take 800 mg by mouth every 6 (six) hours as needed (pain).     lisinopril 10 MG tablet  Commonly known as:  PRINIVIL,ZESTRIL  Take 1 tablet (10 mg total) by mouth daily.     loratadine 10 MG tablet  Commonly known as:  CLARITIN  Take 10 mg by mouth daily.     meclizine 25 MG tablet  Commonly known as:  ANTIVERT  Take 1 tablet (25 mg total) by mouth 3 (three) times daily as needed for dizziness or nausea.     omega-3 acid ethyl  esters 1 G capsule  Commonly known as:  LOVAZA  Take 2 g by mouth daily.           Follow-up Information   Schedule an appointment as soon as possible for a visit with  Melinda Crutch, MD.   Specialty:  Family Medicine   Contact information:   [address] [place] [phone]       Follow up with Faye Ramsay, MD. (As needed call my ell phone [phone])    Specialty:  Internal Medicine   Contact information:   [address] [place] [address] [phone]        The results of significant diagnostics from this hospitalization (including imaging, microbiology, ancillary and laboratory) are listed below for reference.     Microbiology: No results found for this or any previous visit (from the past 240 hour(s)).   Labs: Basic Metabolic Panel:  Recent Labs Lab 10/11/13 1903 10/12/13 0411  NA 140 140  K 3.5* 3.8  CL 101 103  CO2 23 25  GLUCOSE 111* 126*  BUN 14 12  CREATININE 0.81 0.83  CALCIUM 9.6 9.2   Liver Function Tests:  Recent Labs Lab 10/11/13 1903 10/12/13 0411  AST 16 14  ALT 15 14  ALKPHOS 54 51  BILITOT 0.6 0.7  PROT 7.7 6.8  ALBUMIN 4.6 4.0    Recent Labs Lab 10/11/13 1903  LIPASE 33   CBC:  Recent Labs Lab 10/11/13 1903 10/12/13 0411  WBC 13.2* 10.2  HGB 16.7 16.2  HCT 48.7 47.9  MCV 89.4 90.4  PLT 150 154    SIGNED: Time coordinating discharge: Over 30 minutes  Faye Ramsay, MD  [facility] 10/13/2013, 7:29 AM Pager [phone]  If 7PM-7AM, please contact night-coverage [email or web address] Password [email or web address]

## 2013-10-17 ENCOUNTER — Other Ambulatory Visit: Payer: Self-pay | Admitting: Family Medicine

## 2013-10-17 DIAGNOSIS — R42 Dizziness and giddiness: Secondary | ICD-10-CM

## 2013-10-21 ENCOUNTER — Ambulatory Visit
Admission: RE | Admit: 2013-10-21 | Discharge: 2013-10-21 | Disposition: A | Discharge status: Home / Self Care | Payer: BC Managed Care – PPO | Source: Ambulatory Visit | Attending: Family Medicine | Admitting: Family Medicine

## 2013-10-21 DIAGNOSIS — R42 Dizziness and giddiness: Secondary | ICD-10-CM

## 2013-10-23 ENCOUNTER — Ambulatory Visit
Admission: RE | Admit: 2013-10-23 | Discharge: 2013-10-23 | Disposition: A | Discharge status: Home / Self Care | Payer: BC Managed Care – PPO | Source: Ambulatory Visit | Attending: Family Medicine | Admitting: Family Medicine

## 2013-10-23 ENCOUNTER — Other Ambulatory Visit: Payer: Self-pay | Admitting: Family Medicine

## 2013-10-23 DIAGNOSIS — R9389 Abnormal findings on diagnostic imaging of other specified body structures: Secondary | ICD-10-CM

## 2013-10-28 ENCOUNTER — Other Ambulatory Visit: Payer: Self-pay | Admitting: Ophthalmology

## 2013-10-28 ENCOUNTER — Other Ambulatory Visit: Payer: Self-pay | Admitting: Family Medicine

## 2013-10-28 DIAGNOSIS — E041 Nontoxic single thyroid nodule: Secondary | ICD-10-CM

## 2013-10-30 ENCOUNTER — Other Ambulatory Visit (HOSPITAL_COMMUNITY)
Admission: RE | Admit: 2013-10-30 | Discharge: 2013-10-30 | Disposition: A | Discharge status: Home / Self Care | Payer: BC Managed Care – PPO | Source: Ambulatory Visit | Attending: Interventional Radiology | Admitting: Interventional Radiology

## 2013-10-30 ENCOUNTER — Ambulatory Visit
Admission: RE | Admit: 2013-10-30 | Discharge: 2013-10-30 | Disposition: A | Discharge status: Home / Self Care | Payer: BC Managed Care – PPO | Source: Ambulatory Visit | Attending: Family Medicine | Admitting: Family Medicine

## 2013-10-30 DIAGNOSIS — E041 Nontoxic single thyroid nodule: Secondary | ICD-10-CM | POA: Insufficient documentation

## 2013-11-13 ENCOUNTER — Telehealth (INDEPENDENT_AMBULATORY_CARE_PROVIDER_SITE_OTHER): Payer: Self-pay

## 2013-11-13 ENCOUNTER — Ambulatory Visit (INDEPENDENT_AMBULATORY_CARE_PROVIDER_SITE_OTHER): Payer: BC Managed Care – PPO | Admitting: Surgery

## 2013-11-13 ENCOUNTER — Encounter (INDEPENDENT_AMBULATORY_CARE_PROVIDER_SITE_OTHER): Payer: Self-pay | Admitting: Surgery

## 2013-11-13 VITALS — BP 160/92 | HR 60 | Temp 98.6°F | Ht 71.0 in | Wt 211.1 lb

## 2013-11-13 DIAGNOSIS — E041 Nontoxic single thyroid nodule: Secondary | ICD-10-CM

## 2013-11-13 NOTE — Telephone Encounter (Signed)
Order for thyroid u/s to be done feb/mar of 2016 in epic and to ref coord to set up.

## 2013-11-13 NOTE — Progress Notes (Signed)
General Surgery [facility]  Chief Complaint  Patient presents with  . New Evaluation    right thyroid nodule - referral from Dr. Myriam Jacobson    HISTORY: Patient is a 58 year old male referred by his primary care physician for evaluation of newly diagnosed right thyroid nodule. Patient had been hospitalized with uncontrolled hypertension. Part of his evaluation included a carotid duplex exam. An incidental finding was made of a right-sided thyroid nodule.  On 10/23/2013 the patient underwent a thyroid ultrasound. This showed a slightly enlarged right thyroid lobe measuring 5.9 x 2.6 x 1.9 cm. There was a solitary inferior solid thyroid nodule on the right side measuring 2.5 x 2.8 x 1.6 cm. Left thyroid lobe was normal in size and there were no nodules. There was no lymphadenopathy.  Patient subsequently underwent ultrasound-guided fine needle aspiration biopsy. This showed a follicular lesion without evidence of cytologic atypia.  Patient has no prior history of thyroid disease. He has never been on thyroid medication. He has had no prior head or neck surgery. There is no family history of thyroid cancer. There is no family history of other endocrinopathy.  Past Medical History  Diagnosis Date  . Allergy   . Hypertension     Current Outpatient Prescriptions  Medication Sig Dispense Refill  . aspirin 81 MG chewable tablet Chew 81 mg by mouth daily.      Marland Kitchen lisinopril (PRINIVIL,ZESTRIL) 10 MG tablet Take 20 mg by mouth daily.      Marland Kitchen loratadine (CLARITIN) 10 MG tablet Take 10 mg by mouth daily.      Marland Kitchen omega-3 acid ethyl esters (LOVAZA) 1 G capsule Take 2 g by mouth daily.       Marland Kitchen ibuprofen (ADVIL,MOTRIN) 200 MG tablet Take 800 mg by mouth every 6 (six) hours as needed (pain).       No current facility-administered medications for this visit.    Allergies  Allergen Reactions  . Penicillins Hives and Rash    Family History  Problem Relation Age of Onset  .  Cancer - Colon Father     History   Social History  . Marital Status: Divorced    Spouse Name: N/A    Number of Children: N/A  . Years of Education: N/A   Social History Main Topics  . Smoking status: Current Some Day Smoker -- 0 years    Types: Cigarettes  . Smokeless tobacco: None  . Alcohol Use: Yes     Comment: socially   . Drug Use: No  . Sexual Activity: None   Other Topics Concern  . None   Social History Narrative  . None    REVIEW OF SYSTEMS - PERTINENT POSITIVES ONLY: Denies tremor. Denies palpitation. Denies compressive symptoms. Denies new mass in the neck.  EXAM: Filed Vitals:   11/13/13 1143  BP: 160/92  Pulse: 60  Temp: 98.6 F (37 C)    GENERAL: well-developed, well-nourished, no acute distress HEENT: normocephalic; pupils equal and reactive; sclerae clear; dentition good; mucous membranes moist NECK:  Left thyroid lobe without palpable abnormality; right thyroid lobe with smooth mobile nontender nodule at the inferior pole extending beneath the head of the clavicle measuring approximately 3 cm; symmetric on extension; no palpable anterior or posterior cervical lymphadenopathy; no supraclavicular masses; no tenderness CHEST: clear to auscultation bilaterally without rales, rhonchi, or wheezes CARDIAC: regular rate and rhythm without significant murmur; peripheral pulses are full EXT:  non-tender without edema; no deformity NEURO: no  gross focal deficits; no sign of tremor   LABORATORY RESULTS: See [facility] ([facility]) for most recent results  RADIOLOGY RESULTS: See [facility] ([facility]) for most recent results  IMPRESSION: Right thyroid nodule, 2.8 cm, follicular, likely benign  PLAN: The patient and I reviewed the above studies at length. I provided him with written literature to review at home. We discussed options for management including surgical resection for definitive diagnosis or close observation with repeat ultrasound and  physical examination.  After careful consideration, the patient agrees with close observation. I plan to see him back in 6 months with a repeat thyroid ultrasound. We will examine his neck.  Patient does need a current TSH level. He is seeing his primary care physician in 2 weeks and will have the study done at that time. I will request of his primary care physician forward those results to my office.  Patient will return in 6 months for evaluation.  [name], MD, [facility]  Primary Care Physician:  Melinda Crutch, MD

## 2013-11-13 NOTE — Patient Instructions (Signed)
Thyroid-Stimulating Hormone A thyroid-stimulating hormone (TSH) test is a blood test that is done to measure the level of TSH, also known as thyrotropin, in your blood. Knowing the level of TSH in your blood can help your health care provider:  Diagnose a thyroid gland or pituitary gland disorder.  Manage your condition and treatment if you have hypothyroidism or hyperthyroidism. TSH is produced by the pituitary gland. The pituitary gland is a small organ located just below the brain, behind your eyes and nasal passages. It is part of a system that monitors and maintains thyroid hormone levels and thyroid gland function. Thyroid hormones affect many body parts and systems, including the system that affects how quickly your body burns fuel for energy. Your health care provider may recommend testing your TSH level if you have signs and symptoms that are often associated with abnormal thyroid hormone levels. The blood used for testing is obtained through a needle placed in a vein in your arm. RESULTS It is your responsibility to obtain your test results. Ask the lab or department performing the test when and how you will get your results. Contact your health care provider to discuss any questions you have about your results.  Your health care provider will go over the test results with you and discuss the importance and meaning of your results, as well as the need for additional tests if necessary. Range of Normal Values  Ranges for normal values may vary among different labs and hospitals. You should always check with your health care provider after having lab work or other tests done to discuss whether your values are considered within normal limits.  Adult: 0.3-5 microU/mL or 0.3-5 mU/L (SI units).  Newborn: 3-18 microU/mL or 3-18 mU/L.  Cord: 3-12 microU/mL or 3-12 mU/L. Meaning of Results Outside Normal Value Ranges A high level of TSH may mean:  Your thyroid gland is not making enough  thyroid hormones. When the thyroid gland does not make enough thyroid hormones, the pituitary gland releases TSH into the bloodstream. The higher-than-normal levels of TSH prompt the thyroid gland to release more thyroid hormones.  You are getting an insufficient level of thyroid hormone medicine, if you are receiving this type of treatment.  There is a problem with the pituitary gland (rare). A low level of TSH can indicate a problem with the pituitary gland. Document Released: 04/01/2004 Document Revised: 07/22/2013 Document Reviewed: 02/17/2008 [employer] Patient Information 2015 [employer]. This information is not intended to replace advice given to you by your health care provider. Make sure you discuss any questions you have with your health care provider.  

## 2013-11-15 ENCOUNTER — Ambulatory Visit
Admission: RE | Admit: 2013-11-15 | Discharge: 2013-11-15 | Disposition: A | Discharge status: Home / Self Care | Payer: No Typology Code available for payment source | Source: Ambulatory Visit | Attending: Cardiology | Admitting: Cardiology

## 2013-11-15 ENCOUNTER — Other Ambulatory Visit: Payer: Self-pay | Admitting: Cardiology

## 2013-11-15 DIAGNOSIS — Z8249 Family history of ischemic heart disease and other diseases of the circulatory system: Secondary | ICD-10-CM

## 2014-01-03 ENCOUNTER — Other Ambulatory Visit: Payer: Self-pay

## 2014-04-21 ENCOUNTER — Ambulatory Visit
Admission: RE | Admit: 2014-04-21 | Discharge: 2014-04-21 | Disposition: A | Discharge status: Home / Self Care | Payer: BLUE CROSS/BLUE SHIELD | Source: Ambulatory Visit | Attending: Surgery | Admitting: Surgery

## 2014-04-21 DIAGNOSIS — E041 Nontoxic single thyroid nodule: Secondary | ICD-10-CM

## 2015-04-13 ENCOUNTER — Other Ambulatory Visit: Payer: Self-pay | Admitting: Surgery

## 2015-04-13 DIAGNOSIS — E041 Nontoxic single thyroid nodule: Secondary | ICD-10-CM

## 2015-04-20 ENCOUNTER — Ambulatory Visit
Admission: RE | Admit: 2015-04-20 | Discharge: 2015-04-20 | Disposition: A | Discharge status: Home / Self Care | Payer: Managed Care, Other (non HMO) | Source: Ambulatory Visit | Attending: Surgery | Admitting: Surgery

## 2015-04-20 DIAGNOSIS — E041 Nontoxic single thyroid nodule: Secondary | ICD-10-CM

## 2015-08-22 IMAGING — CR DG CHEST 1V
1 series · 1 of 1 positions shown · non-contrast
Comparison: None.

CLINICAL DATA: dizzy

EXAM:
CHEST - 1 VIEW

[x chest ap]
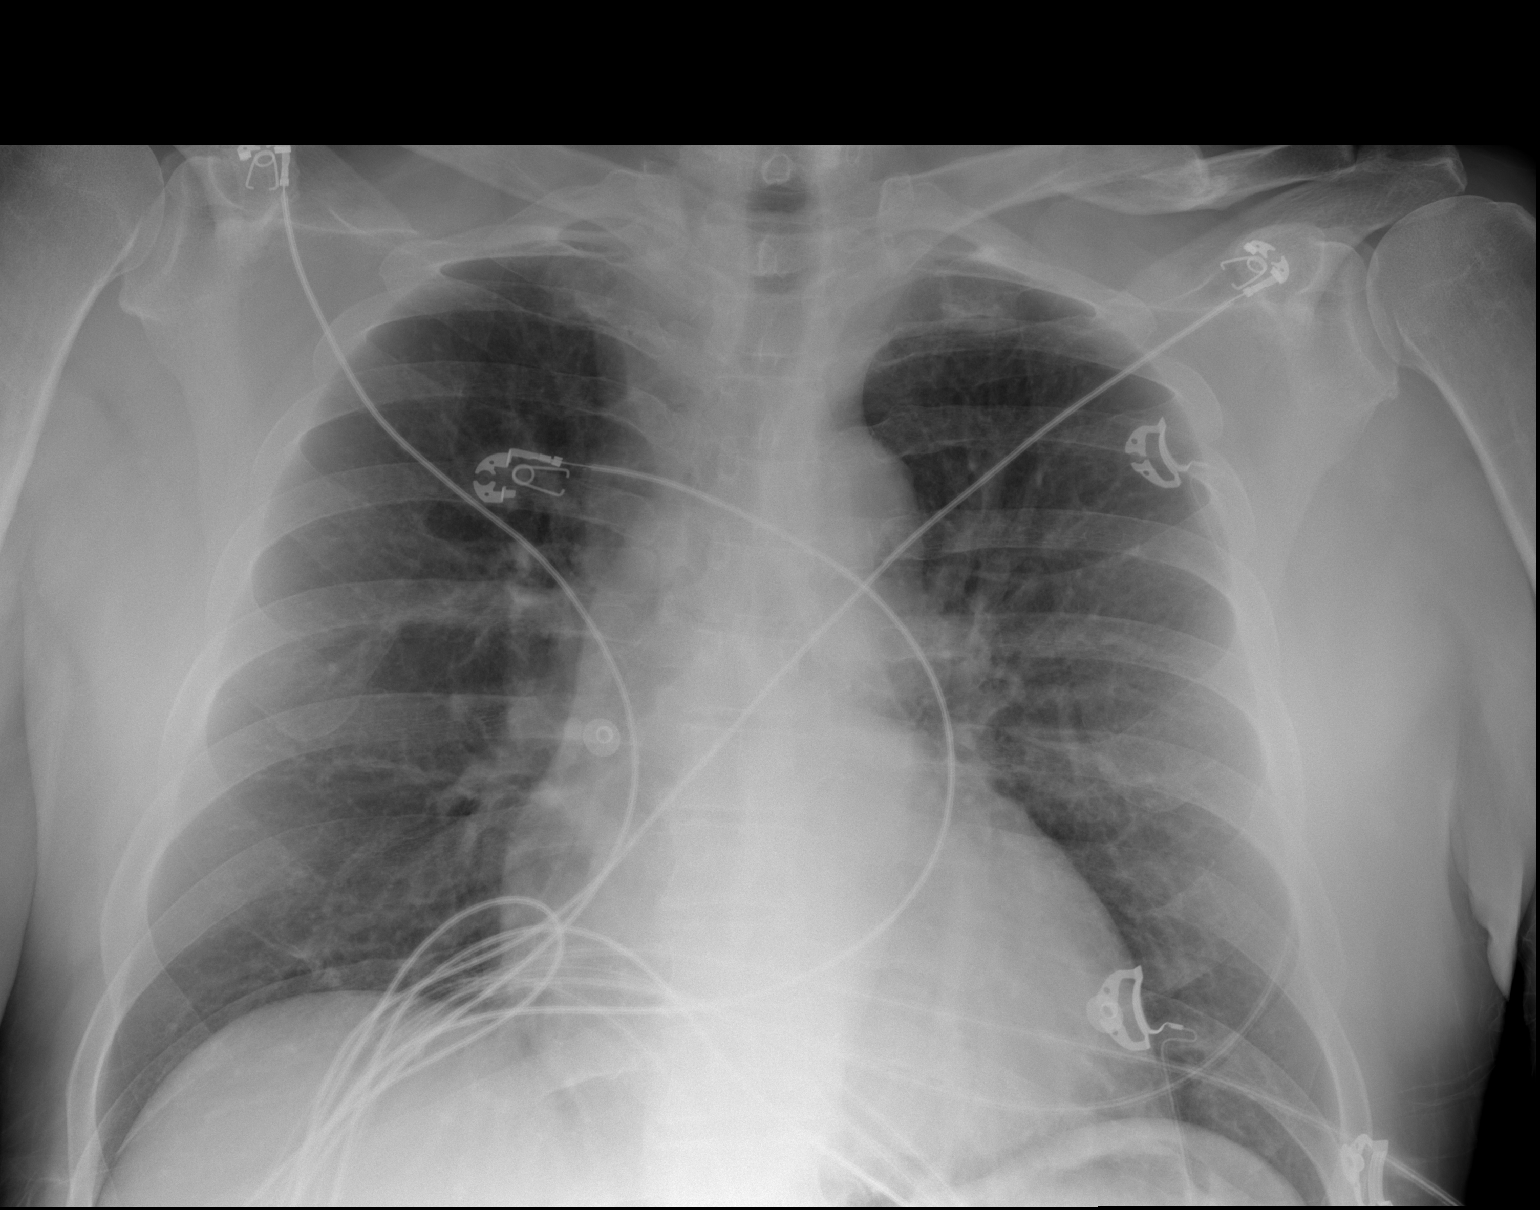

[1 of 1 positions shown; findings below may reference images not displayed]

FINDINGS: The heart size and mediastinal contours are within normal limits.
Both lungs are clear. Left lateral costophrenic angle excluded. Old
healed left clavicle fracture.
IMPRESSION: No active disease.

## 2015-09-01 IMAGING — US US CAROTID DUPLEX BILAT
1 series · 13 of 24 positions shown · non-contrast
Comparison: None.

CLINICAL DATA: DIZZINESS

EXAM:
BILATERAL CAROTID DUPLEX ULTRASOUND
TECHNIQUE: Gray scale imaging, color Doppler and duplex ultrasound was
performed of bilateral carotid and vertebral arteries in the neck.

[Series 1: us carotid duplex bilat · 0.09mm/px · 13 of 61 slices shown]
[im 1/61]
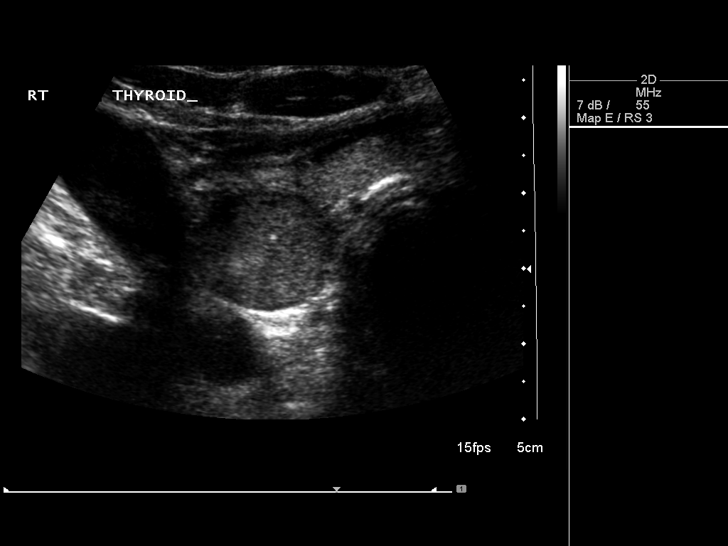
[im 6/61]
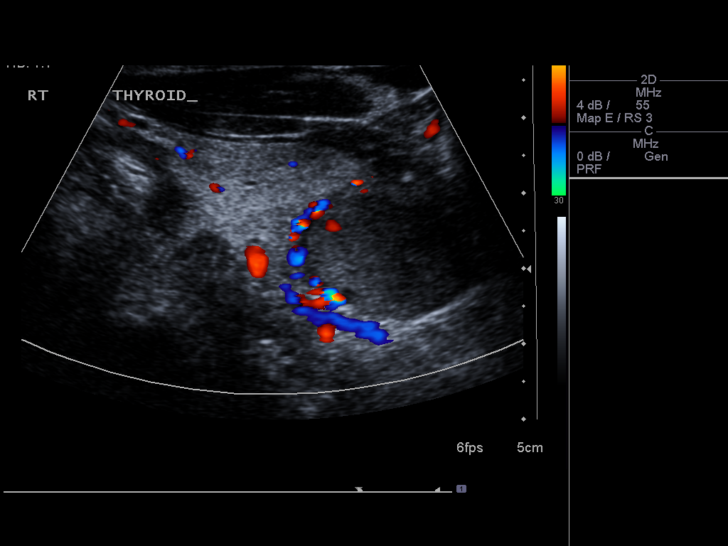
[im 11/61]
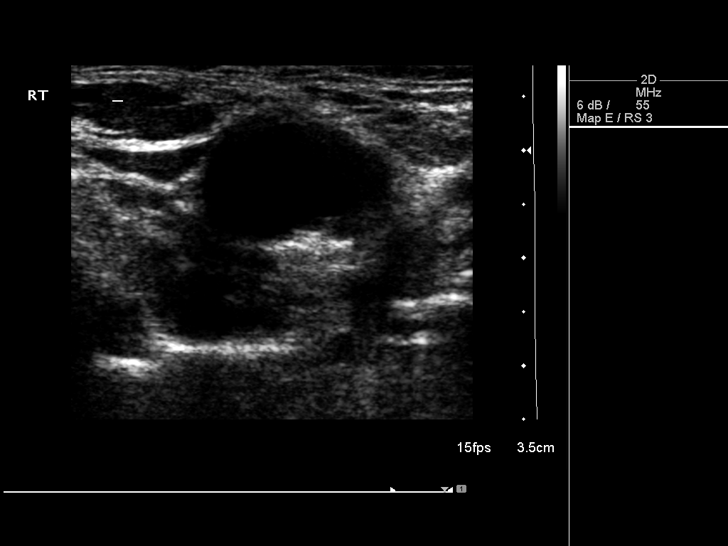
[im 16/61]
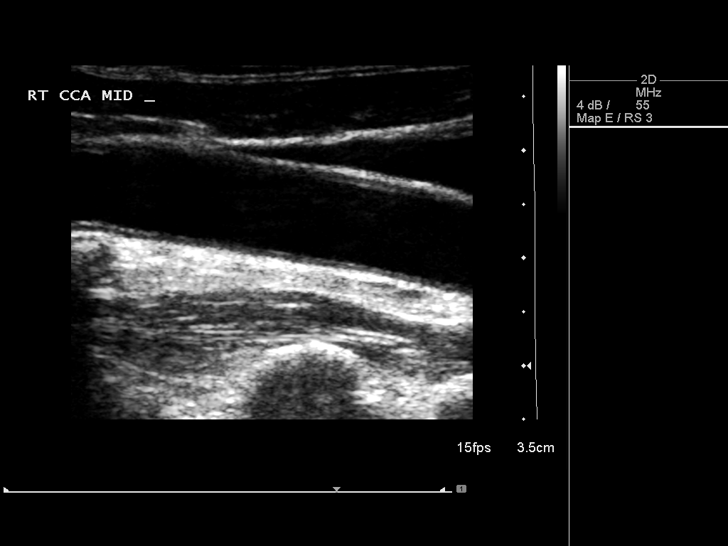
[im 21/61]
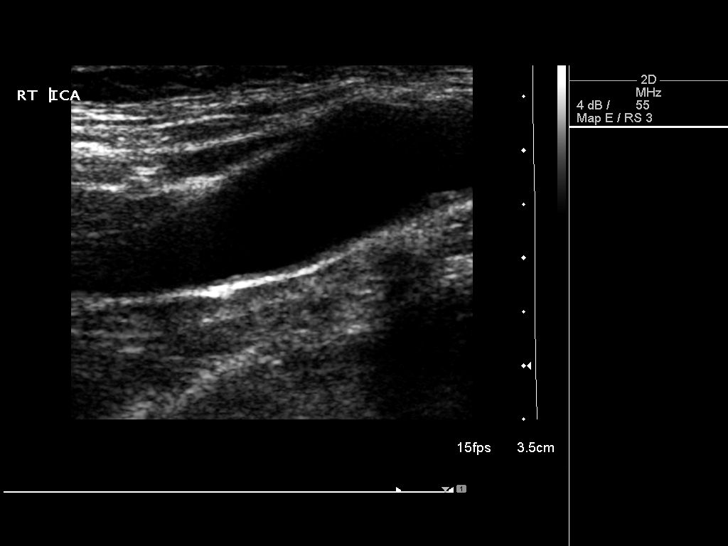
[im 27/61]
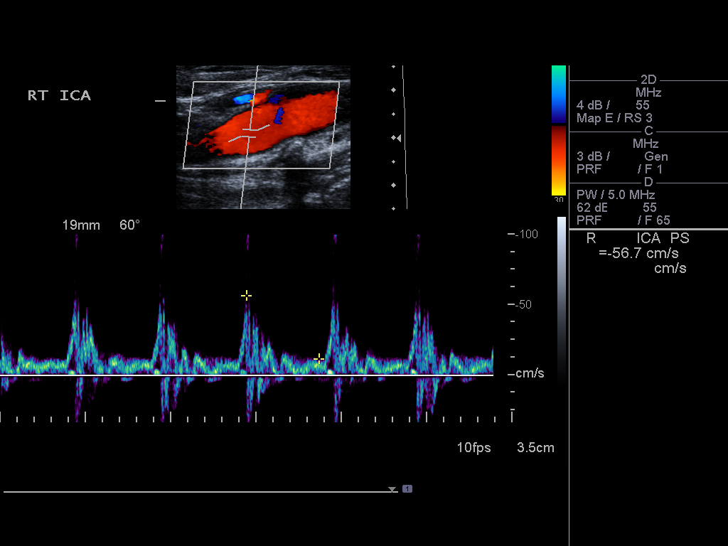
[im 32/61]
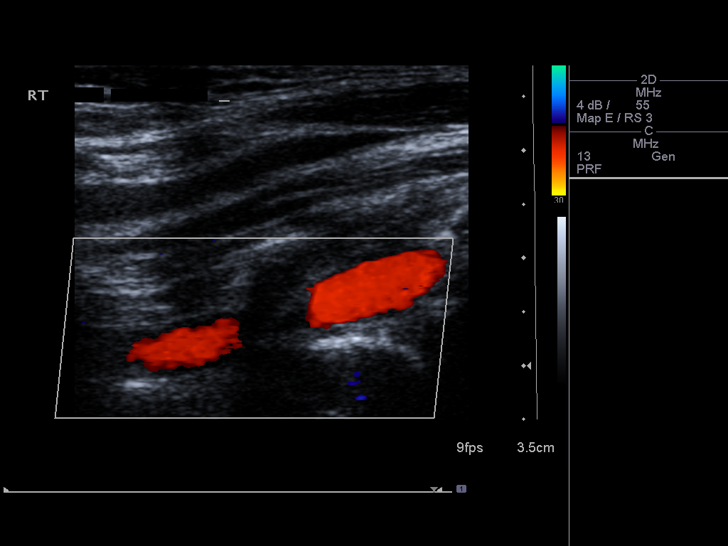
[im 34/61]
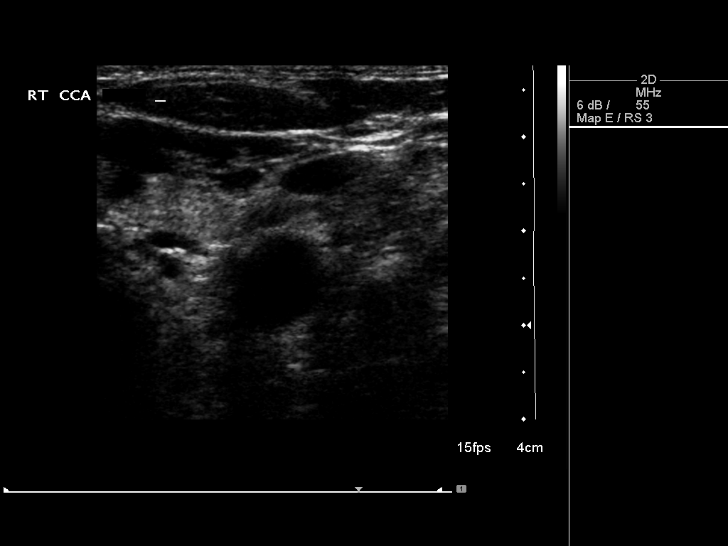
[im 40/61]
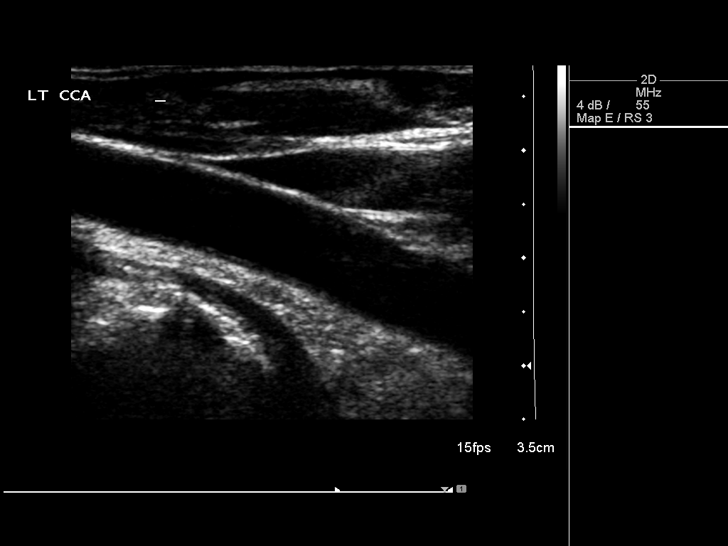
[im 45/61]
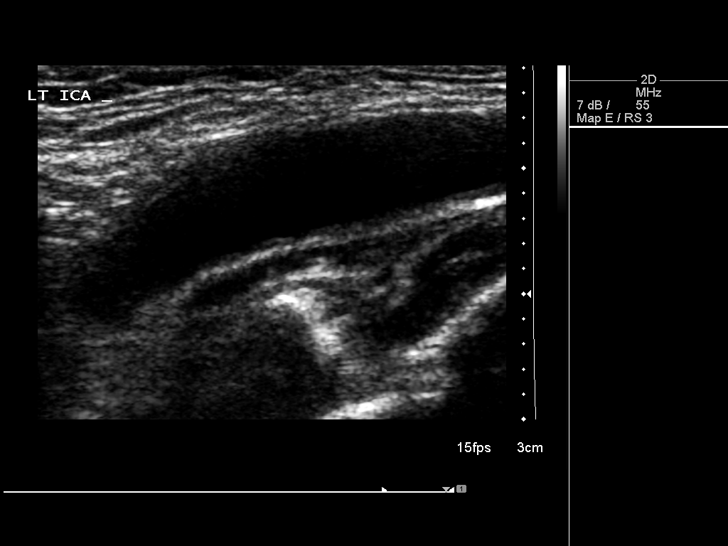
[im 50/61]
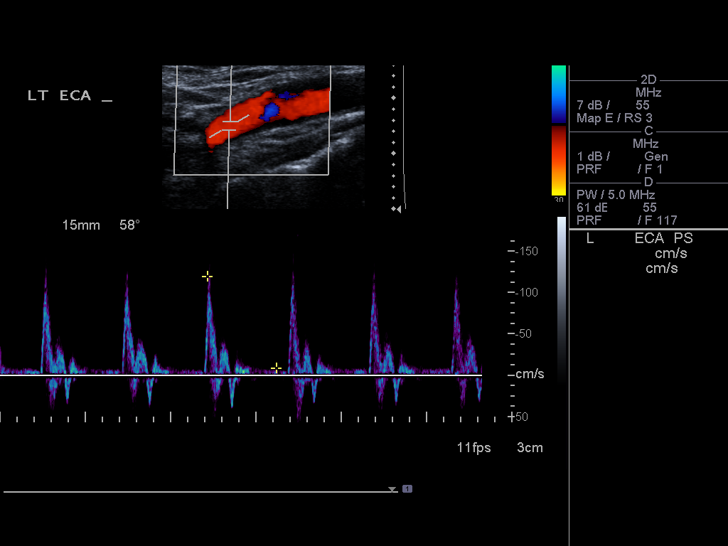
[im 55/61]
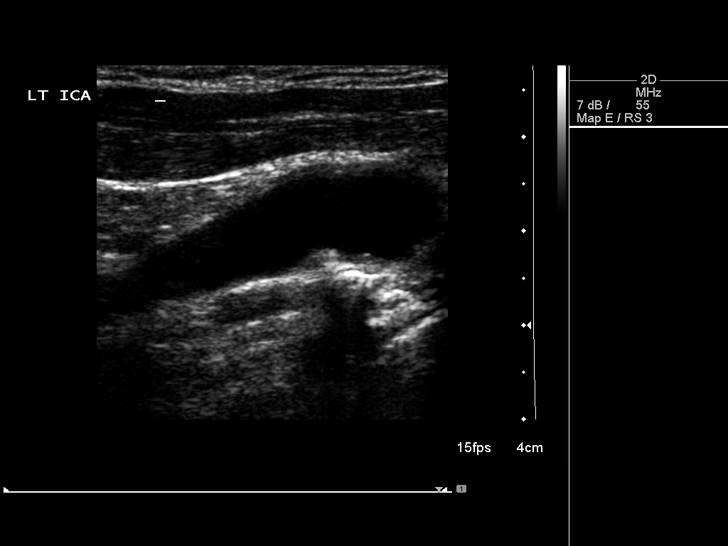
[im 61/61]
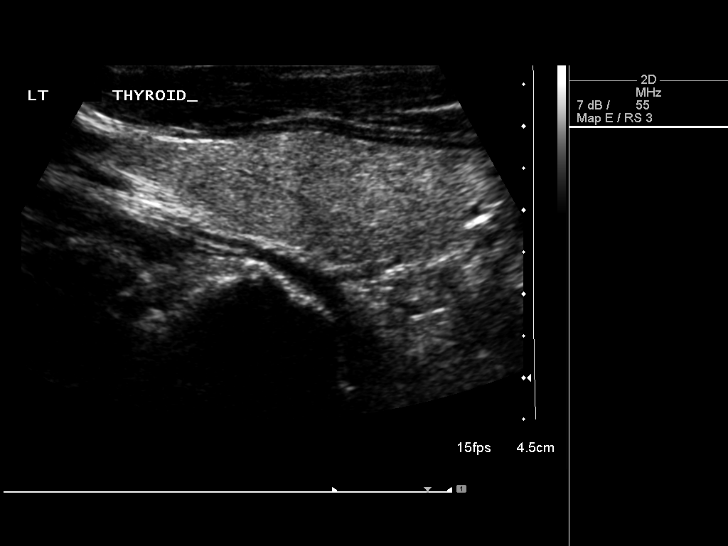

[13 of 24 positions shown; findings below may reference images not displayed]

REVIEW OF SYSTEMS:
Quantification of carotid stenosis is based on velocity parameters
that correlate the residual internal carotid diameter with
NASCET-based stenosis levels, using the diameter of the distal
internal carotid lumen as the denominator for stenosis measurement.

The following velocity measurements were obtained:

PEAK SYSTOLIC/END DIASTOLIC

RIGHT

ICA:                     73/25cm/sec

CCA:                     127/16cm/sec

SYSTOLIC ICA/CCA RATIO:

DIASTOLIC ICA/CCA RATIO:

ECA:                     123cm/sec

LEFT

ICA:                     86/13cm/sec

CCA:                     133/21cm/sec

SYSTOLIC ICA/CCA RATIO:

DIASTOLIC ICA/CCA RATIO:

ECA:                     144cm/sec
FINDINGS: RIGHT CAROTID ARTERY: A 24 mm right thyroid lesion is incidentally
noted. Mild intimal thickening in the common carotid artery. Smooth
plaque in the bulb. No high-grade stenosis. Normal waveforms and
color Doppler signal.

RIGHT VERTEBRAL ARTERY:  Normal flow direction and waveform.

LEFT CAROTID ARTERY: Mild smooth noncalcified plaque at the carotid
bifurcation into the proximal ICA, without high-grade stenosis.
Normal waveforms and color Doppler signal.

LEFT VERTEBRAL ARTERY: Normal flow direction and waveform.
IMPRESSION: 1. Early bilateral carotid bifurcation plaque resulting in less than
50% diameter stenosis. The exam does not exclude plaque ulceration
or embolization. Continued surveillance recommended.
2. 24 mm right thyroid lesion, incompletely assessed. Consider
thyroid ultrasound for further evaluation.

## 2015-09-03 IMAGING — US US SOFT TISSUE HEAD/NECK
1 series · 14 of 25 positions shown · non-contrast
Comparison: Carotid ultrasound 10/21/2013

CLINICAL DATA: Thyroid nodule side carotid ultrasound

EXAM:
THYROID ULTRASOUND
TECHNIQUE: Ultrasound examination of the thyroid gland and adjacent soft
tissues was performed.

[Series 1: us soft tissue head/neck · 0.11mm/px · 14 of 46 slices shown]
[im 1/46]
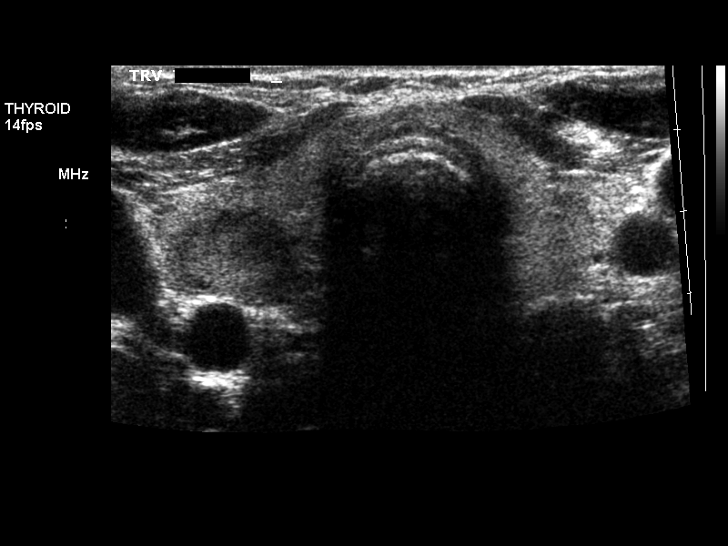
[im 4/46]
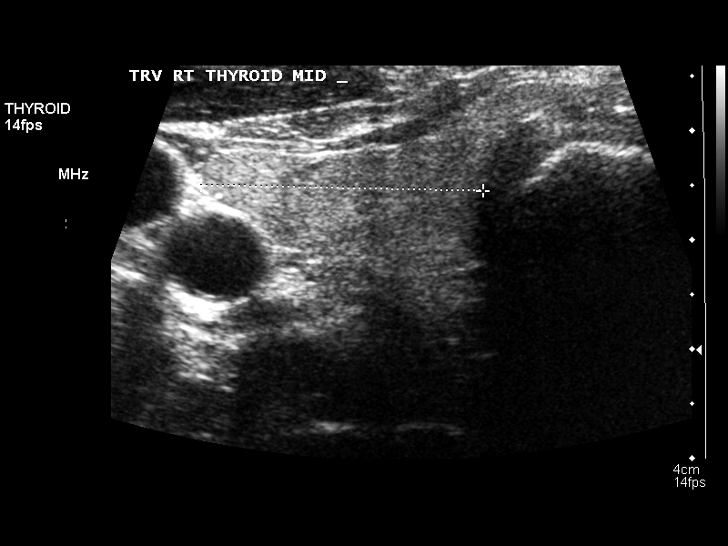
[im 8/46]
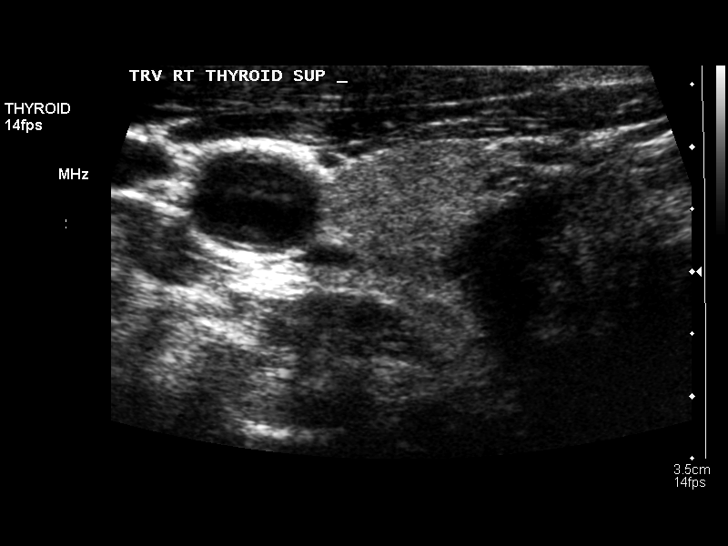
[im 12/46]
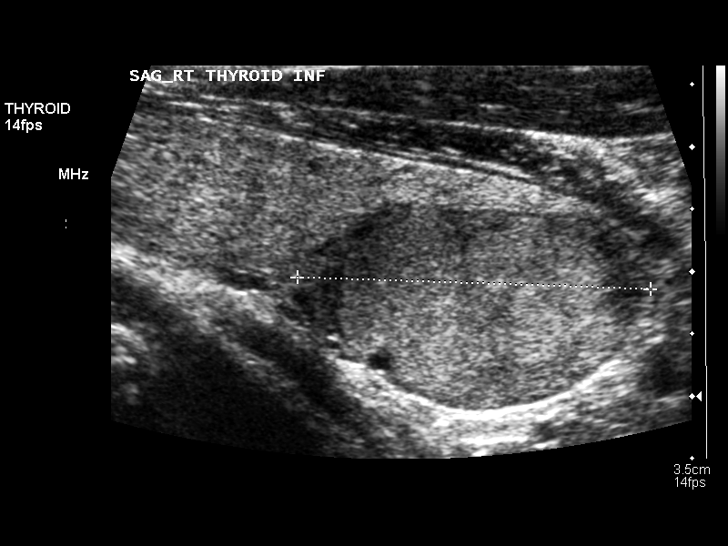
[im 16/46]
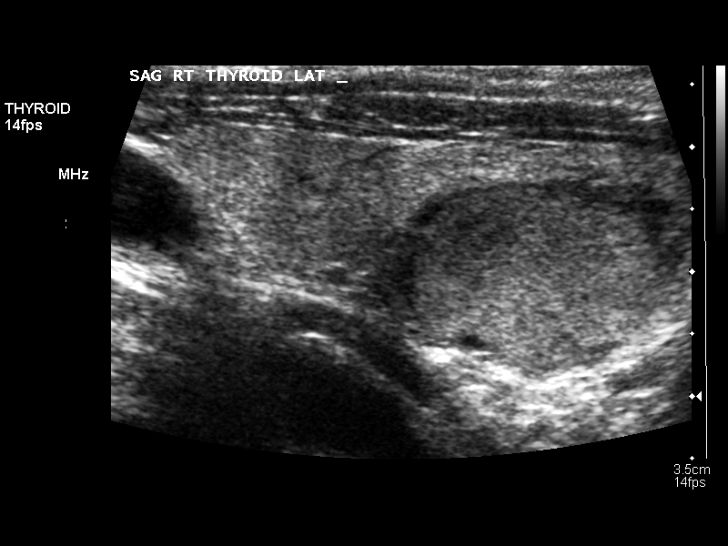
[im 17/46]
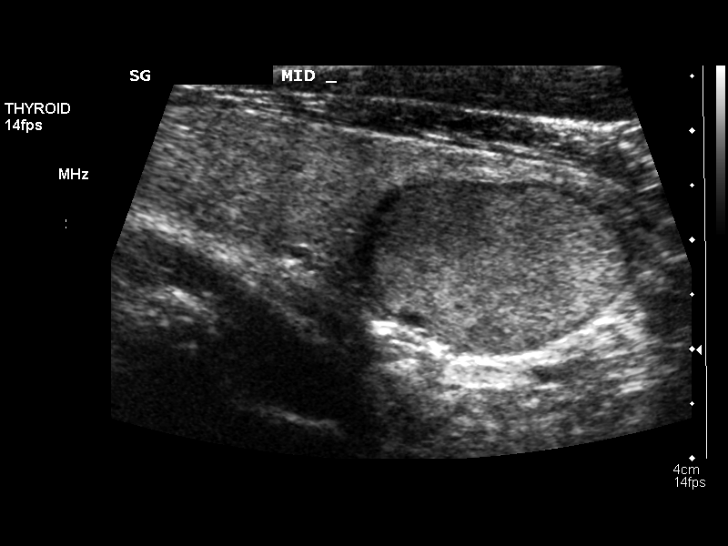
[im 21/46]
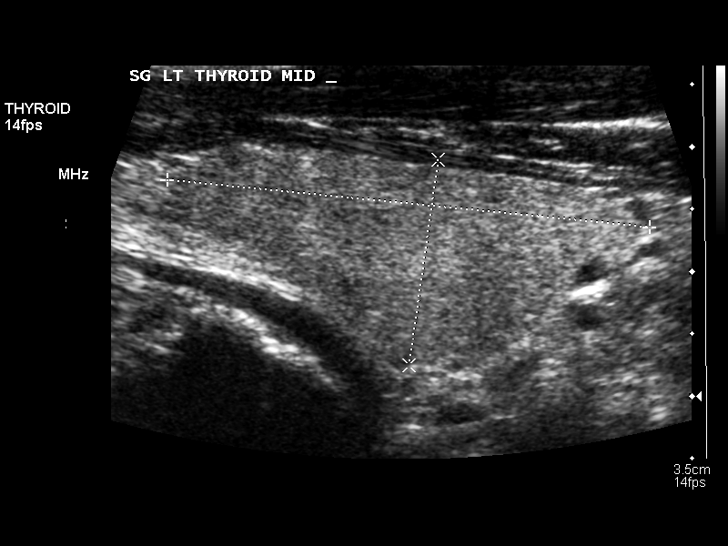
[im 25/46]
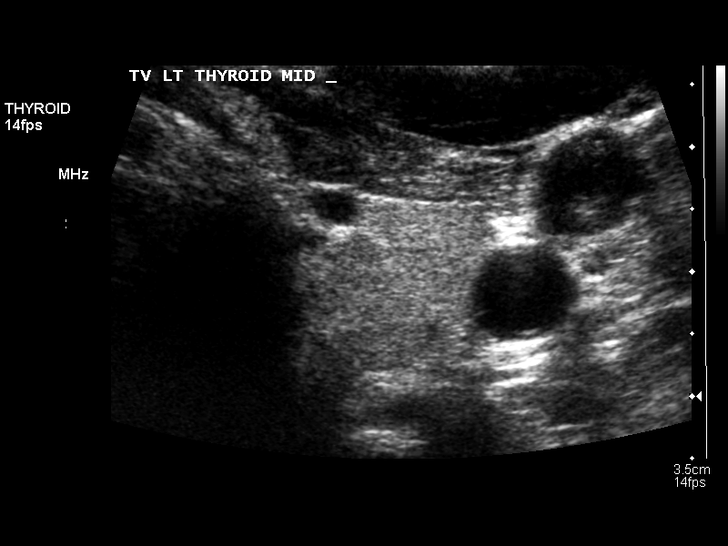
[im 29/46]
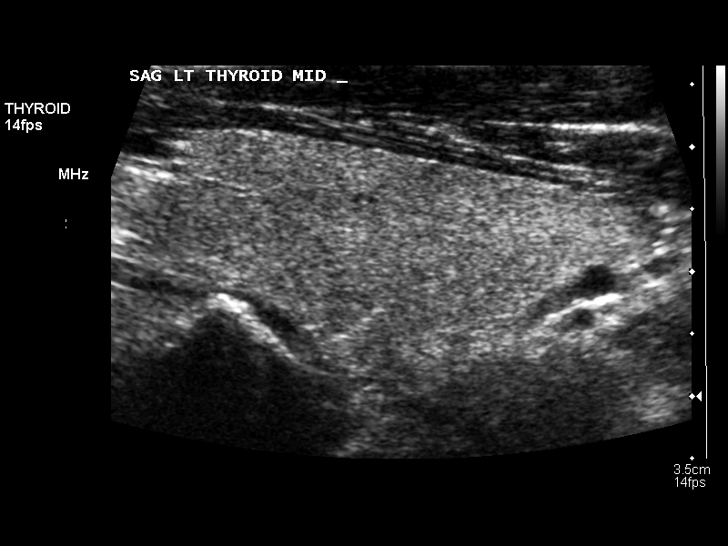
[im 31/46]
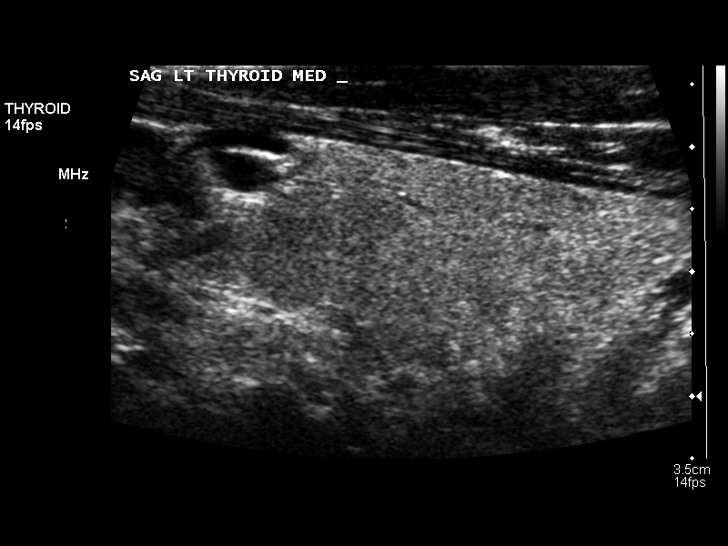
[im 34/46]
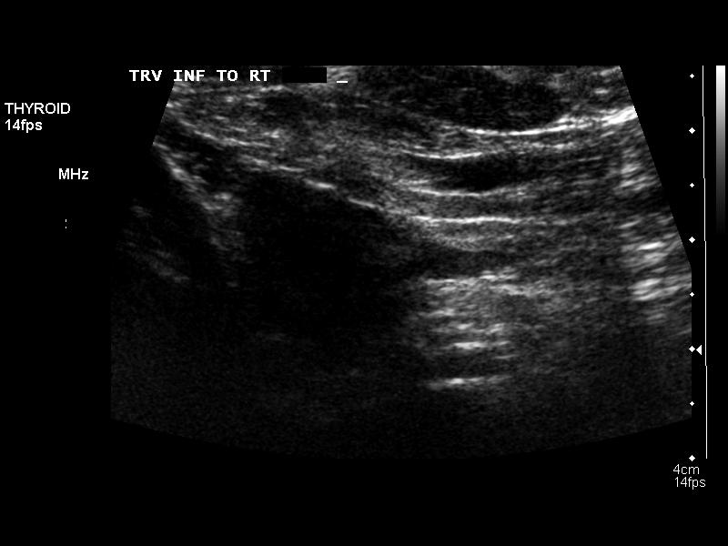
[im 38/46]
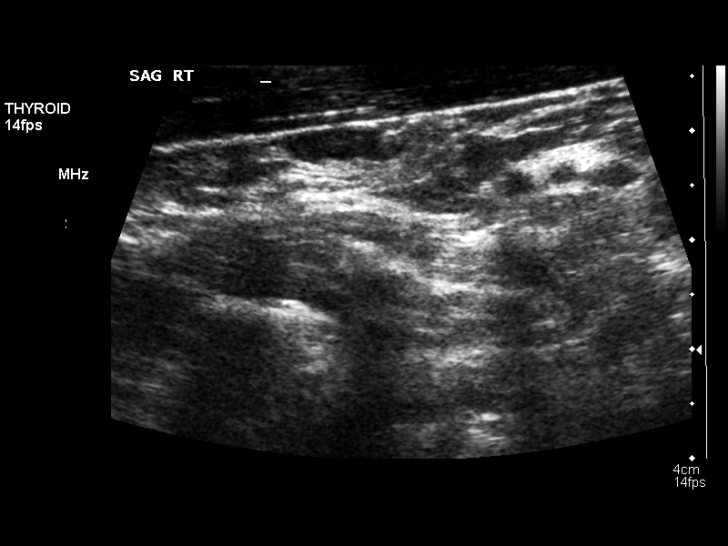
[im 42/46]
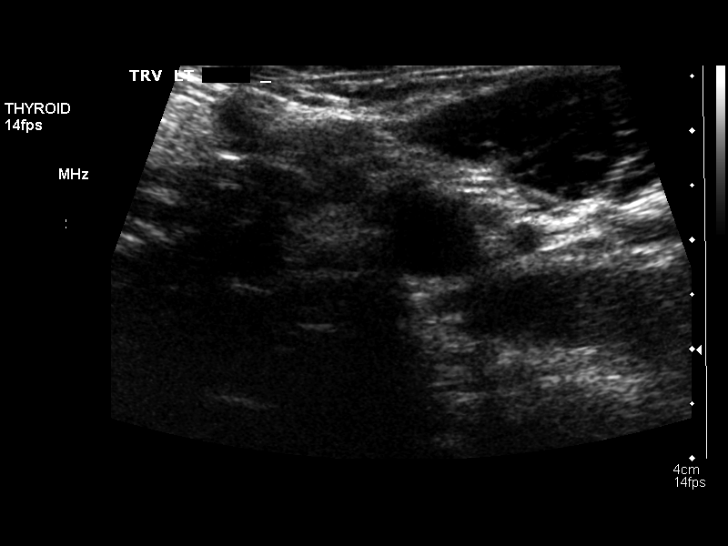
[im 46/46]
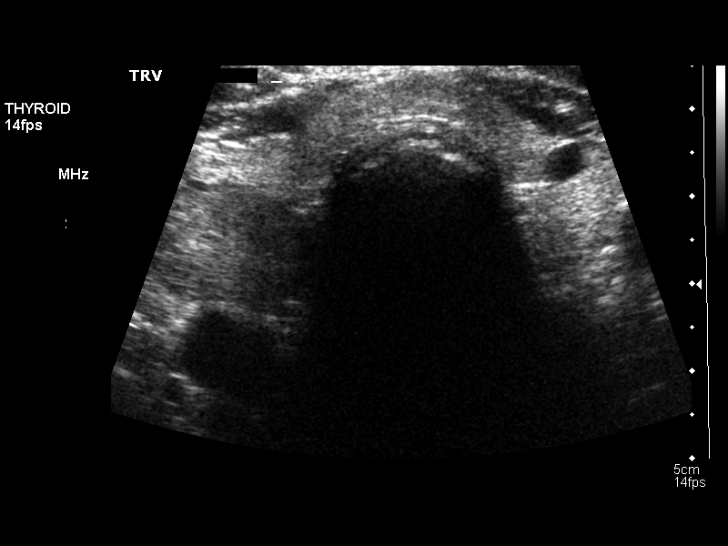

[14 of 25 positions shown; findings below may reference images not displayed]

FINDINGS: Right thyroid lobe

Measurements: 5.9 x 2.6 x 1.9 cm. Solid inferior thyroid lobe nodule
measures 2.5 x 2.8 x 1.6 cm.

Left thyroid lobe

Measurements: 3.9 x 1.9 x 1.7 cm.  No nodules visualized.

Isthmus

Thickness: 0.3 cm.  No nodules visualized.

Lymphadenopathy

None visualized.
IMPRESSION: 2.8 cm right inferior thyroid lobe solid nodule. Findings meet
consensus criteria for biopsy. Ultrasound-guided fine needle
aspiration should be considered, as per the consensus statement:
Management of Thyroid Nodules Detected at US: Society of
Radiologists in Ultrasound Consensus Conference Statement. Radiology

## 2015-09-10 IMAGING — US US THYROID BIOPSY
1 series · 14 of 17 positions shown · non-contrast
Comparison: 10/23/2013

CLINICAL DATA: Dominant right thyroid nodule

EXAM:
ULTRASOUND GUIDED NEEDLE ASPIRATE BIOPSY OF THE THYROID GLAND

[Series 1: us thyroid biopsy · 0.08mm/px · 17 acquisitions, 14 frames shown]
[im 1/17]
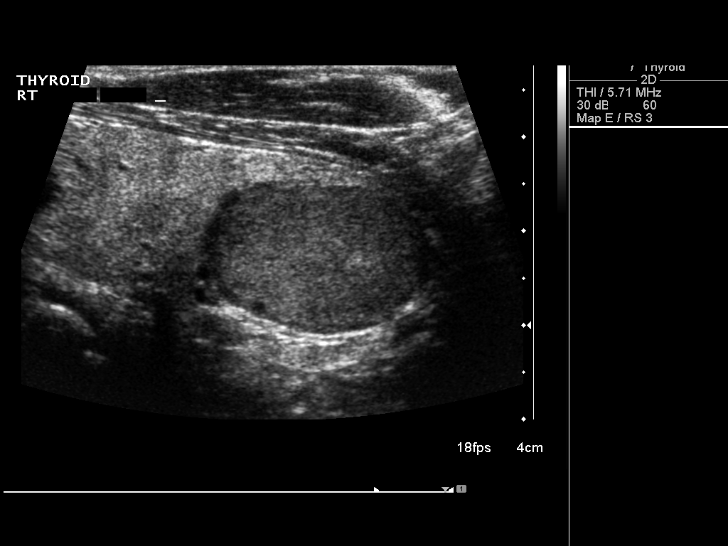
[im 2/17]
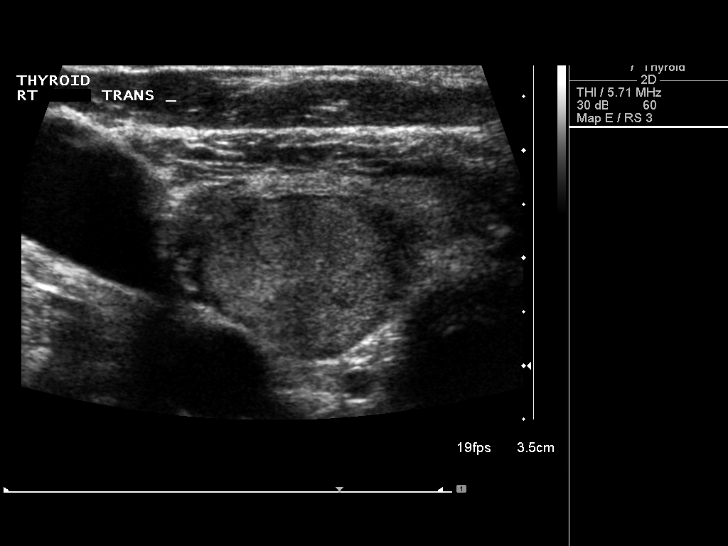
[im 4/17]
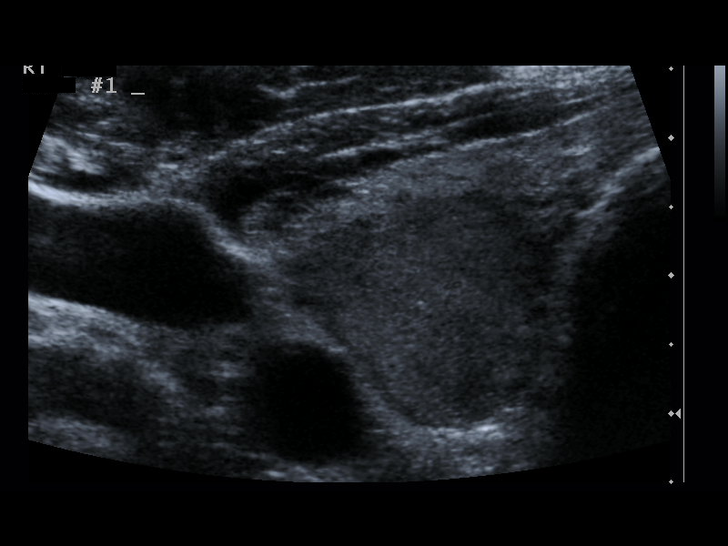
[im 5/17]
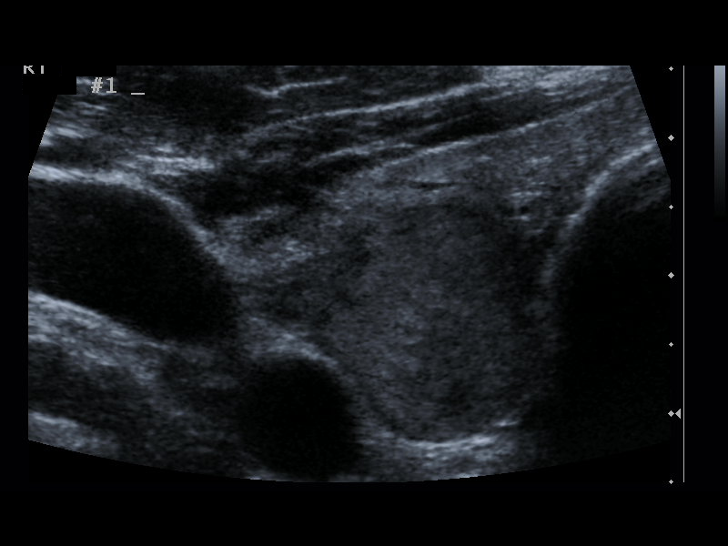
[im 6/17]
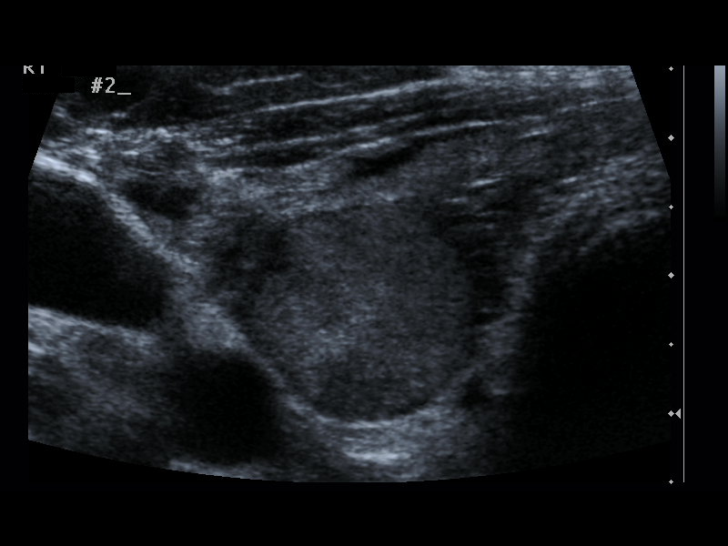
[im 7/17]
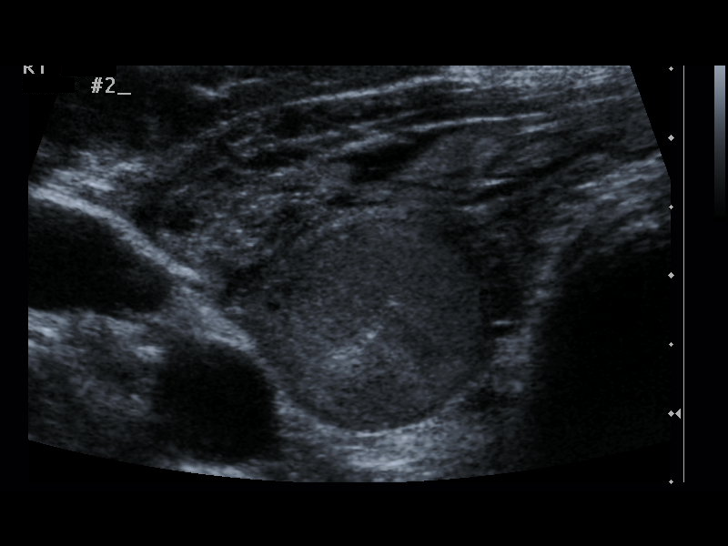
[im 8/17]
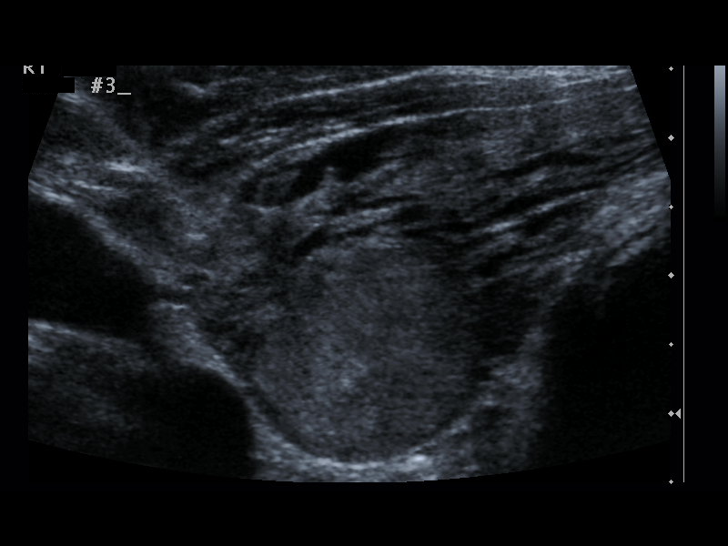
[im 10/17]
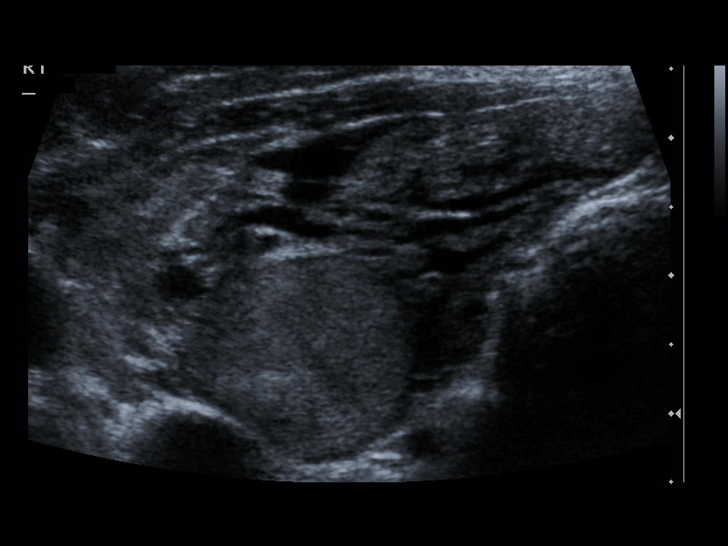
[im 11/17]
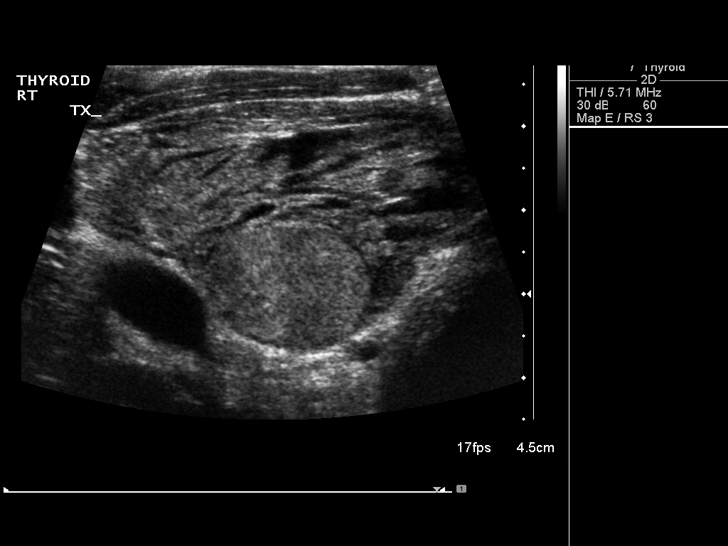
[im 12/17]
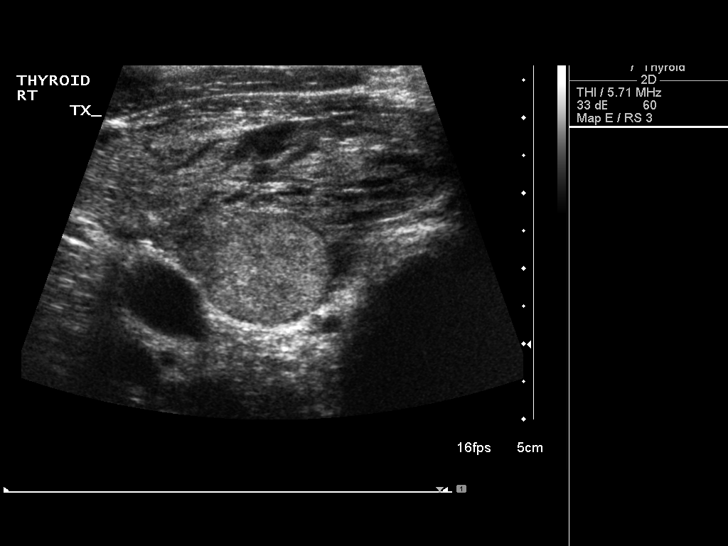
[im 13/17]
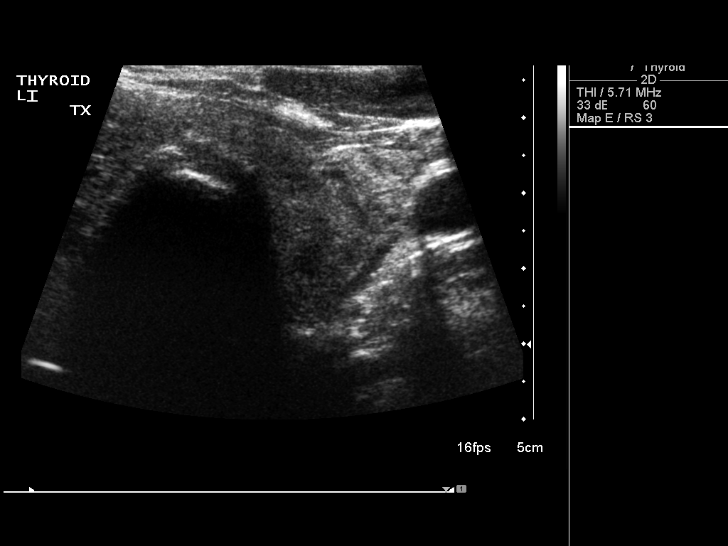
[im 14/17]
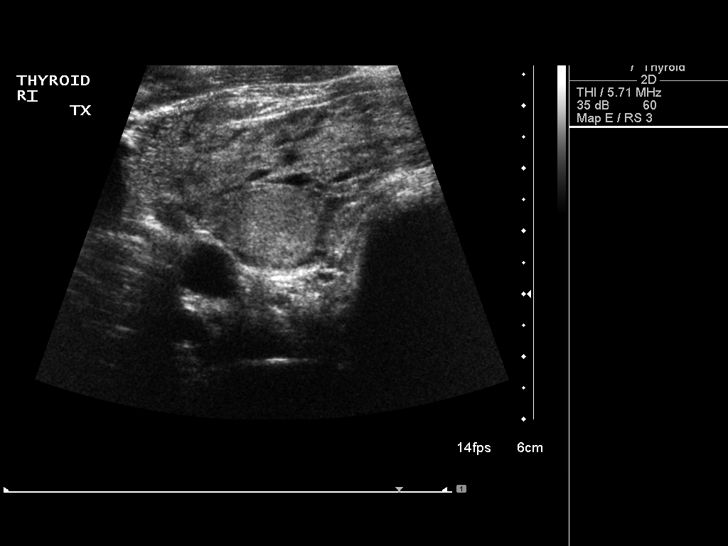
[im 16/17]
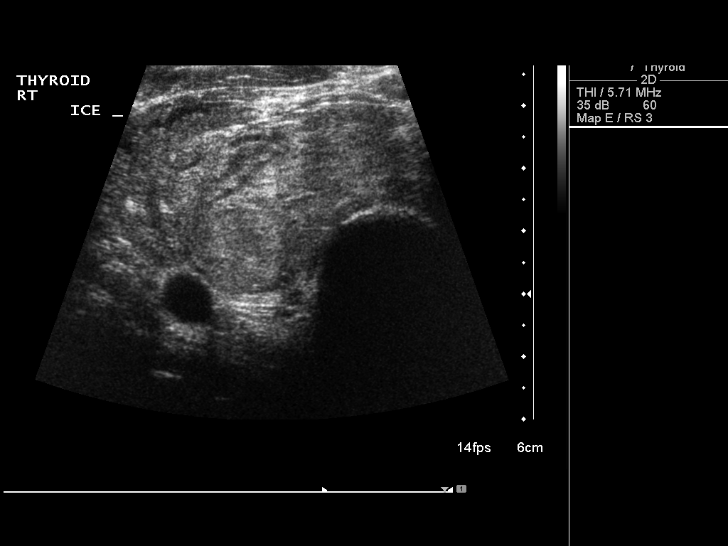
[im 17/17]
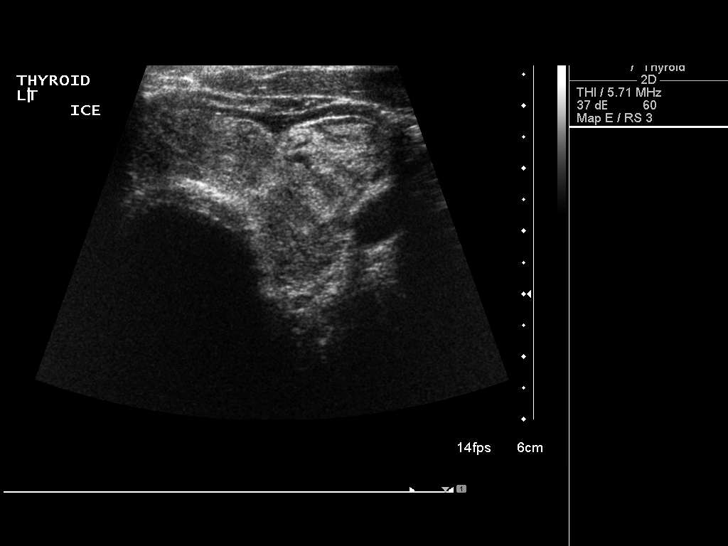

[14 of 17 positions shown; findings below may reference images not displayed]

PROCEDURE:
Thyroid biopsy was thoroughly discussed with the patient and
questions were answered. The benefits, risks, alternatives, and
complications were also discussed. The patient understands and
wishes to proceed with the procedure. Written consent was obtained.

Ultrasound was performed to localize and mark an adequate site for
the biopsy. The patient was then prepped and draped in a normal
sterile fashion. Local anesthesia was provided with 1% lidocaine.
Using direct ultrasound guidance, 4 passes were made using needles
into the nodule within the right lobe of the thyroid. Ultrasound was
used to confirm needle placements on all occasions. Specimens were
sent to Pathology for analysis.

Complications:  No immediate
FINDINGS: Imaging confirms needle placed in in the dominant right inferior
thyroid nodule
IMPRESSION: Ultrasound guided needle aspirate biopsy performed of the dominant
right inferior thyroid nodule.

## 2016-09-30 DIAGNOSIS — J3089 Other allergic rhinitis: Secondary | ICD-10-CM | POA: Diagnosis not present

## 2016-09-30 DIAGNOSIS — J3081 Allergic rhinitis due to animal (cat) (dog) hair and dander: Secondary | ICD-10-CM | POA: Diagnosis not present

## 2016-09-30 DIAGNOSIS — J301 Allergic rhinitis due to pollen: Secondary | ICD-10-CM | POA: Diagnosis not present

## 2016-10-17 DIAGNOSIS — H34831 Tributary (branch) retinal vein occlusion, right eye, with macular edema: Secondary | ICD-10-CM | POA: Diagnosis not present

## 2016-10-25 DIAGNOSIS — J3089 Other allergic rhinitis: Secondary | ICD-10-CM | POA: Diagnosis not present

## 2016-10-25 DIAGNOSIS — J301 Allergic rhinitis due to pollen: Secondary | ICD-10-CM | POA: Diagnosis not present

## 2016-10-25 DIAGNOSIS — J3081 Allergic rhinitis due to animal (cat) (dog) hair and dander: Secondary | ICD-10-CM | POA: Diagnosis not present

## 2016-10-31 DIAGNOSIS — H34831 Tributary (branch) retinal vein occlusion, right eye, with macular edema: Secondary | ICD-10-CM | POA: Diagnosis not present

## 2016-10-31 DIAGNOSIS — H35033 Hypertensive retinopathy, bilateral: Secondary | ICD-10-CM | POA: Diagnosis not present

## 2016-10-31 DIAGNOSIS — H35423 Microcystoid degeneration of retina, bilateral: Secondary | ICD-10-CM | POA: Diagnosis not present

## 2016-11-15 DIAGNOSIS — J3081 Allergic rhinitis due to animal (cat) (dog) hair and dander: Secondary | ICD-10-CM | POA: Diagnosis not present

## 2016-11-15 DIAGNOSIS — J3089 Other allergic rhinitis: Secondary | ICD-10-CM | POA: Diagnosis not present

## 2016-11-15 DIAGNOSIS — J301 Allergic rhinitis due to pollen: Secondary | ICD-10-CM | POA: Diagnosis not present

## 2016-12-06 DIAGNOSIS — J3089 Other allergic rhinitis: Secondary | ICD-10-CM | POA: Diagnosis not present

## 2016-12-06 DIAGNOSIS — J3081 Allergic rhinitis due to animal (cat) (dog) hair and dander: Secondary | ICD-10-CM | POA: Diagnosis not present

## 2016-12-06 DIAGNOSIS — J301 Allergic rhinitis due to pollen: Secondary | ICD-10-CM | POA: Diagnosis not present

## 2016-12-12 DIAGNOSIS — H34831 Tributary (branch) retinal vein occlusion, right eye, with macular edema: Secondary | ICD-10-CM | POA: Diagnosis not present

## 2016-12-12 DIAGNOSIS — H35033 Hypertensive retinopathy, bilateral: Secondary | ICD-10-CM | POA: Diagnosis not present

## 2016-12-12 DIAGNOSIS — H43813 Vitreous degeneration, bilateral: Secondary | ICD-10-CM | POA: Diagnosis not present

## 2016-12-26 DIAGNOSIS — J3089 Other allergic rhinitis: Secondary | ICD-10-CM | POA: Diagnosis not present

## 2016-12-26 DIAGNOSIS — J301 Allergic rhinitis due to pollen: Secondary | ICD-10-CM | POA: Diagnosis not present

## 2016-12-26 DIAGNOSIS — J3081 Allergic rhinitis due to animal (cat) (dog) hair and dander: Secondary | ICD-10-CM | POA: Diagnosis not present

## 2017-01-16 DIAGNOSIS — J301 Allergic rhinitis due to pollen: Secondary | ICD-10-CM | POA: Diagnosis not present

## 2017-01-16 DIAGNOSIS — J3081 Allergic rhinitis due to animal (cat) (dog) hair and dander: Secondary | ICD-10-CM | POA: Diagnosis not present

## 2017-01-16 DIAGNOSIS — J3089 Other allergic rhinitis: Secondary | ICD-10-CM | POA: Diagnosis not present

## 2017-01-23 DIAGNOSIS — H34831 Tributary (branch) retinal vein occlusion, right eye, with macular edema: Secondary | ICD-10-CM | POA: Diagnosis not present

## 2017-01-23 DIAGNOSIS — H35033 Hypertensive retinopathy, bilateral: Secondary | ICD-10-CM | POA: Diagnosis not present

## 2017-01-23 DIAGNOSIS — H35423 Microcystoid degeneration of retina, bilateral: Secondary | ICD-10-CM | POA: Diagnosis not present

## 2017-02-06 DIAGNOSIS — J301 Allergic rhinitis due to pollen: Secondary | ICD-10-CM | POA: Diagnosis not present

## 2017-02-06 DIAGNOSIS — J3081 Allergic rhinitis due to animal (cat) (dog) hair and dander: Secondary | ICD-10-CM | POA: Diagnosis not present

## 2017-02-06 DIAGNOSIS — J3089 Other allergic rhinitis: Secondary | ICD-10-CM | POA: Diagnosis not present

## 2017-02-20 DIAGNOSIS — D1801 Hemangioma of skin and subcutaneous tissue: Secondary | ICD-10-CM | POA: Diagnosis not present

## 2017-02-20 DIAGNOSIS — D0462 Carcinoma in situ of skin of left upper limb, including shoulder: Secondary | ICD-10-CM | POA: Diagnosis not present

## 2017-02-20 DIAGNOSIS — L570 Actinic keratosis: Secondary | ICD-10-CM | POA: Diagnosis not present

## 2017-02-20 DIAGNOSIS — D225 Melanocytic nevi of trunk: Secondary | ICD-10-CM | POA: Diagnosis not present

## 2017-02-23 DIAGNOSIS — H35423 Microcystoid degeneration of retina, bilateral: Secondary | ICD-10-CM | POA: Diagnosis not present

## 2017-02-23 DIAGNOSIS — H35033 Hypertensive retinopathy, bilateral: Secondary | ICD-10-CM | POA: Diagnosis not present

## 2017-02-23 DIAGNOSIS — H34831 Tributary (branch) retinal vein occlusion, right eye, with macular edema: Secondary | ICD-10-CM | POA: Diagnosis not present

## 2017-02-28 DIAGNOSIS — J301 Allergic rhinitis due to pollen: Secondary | ICD-10-CM | POA: Diagnosis not present

## 2017-02-28 DIAGNOSIS — J3081 Allergic rhinitis due to animal (cat) (dog) hair and dander: Secondary | ICD-10-CM | POA: Diagnosis not present

## 2017-02-28 DIAGNOSIS — J3089 Other allergic rhinitis: Secondary | ICD-10-CM | POA: Diagnosis not present

## 2017-02-28 IMAGING — US US SOFT TISSUE HEAD/NECK
1 series · 14 of 25 positions shown · non-contrast
Comparison: Prior thyroid ultrasound 04/21/2014

CLINICAL DATA: 59-year-old male with right thyroid nodule

EXAM:
THYROID ULTRASOUND
TECHNIQUE: Ultrasound examination of the thyroid gland and adjacent soft
tissues was performed.

[Series 1: us soft tissue head/neck · 0.11mm/px · 14 of 47 slices shown]
[im 1/47]
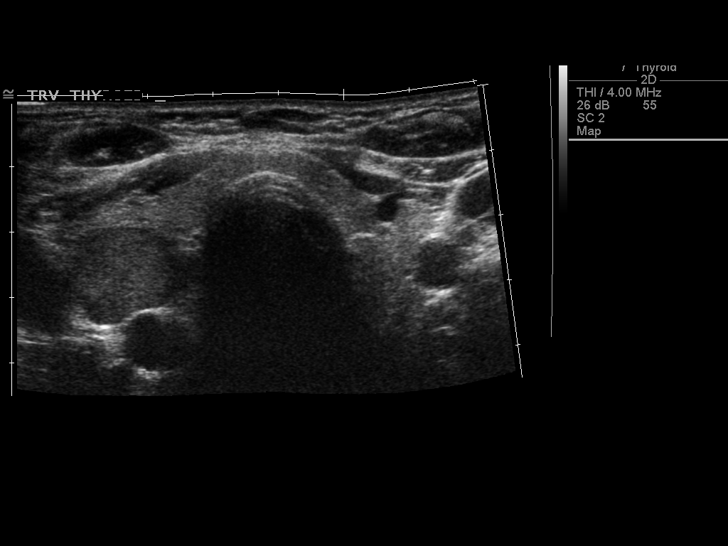
[im 4/47]
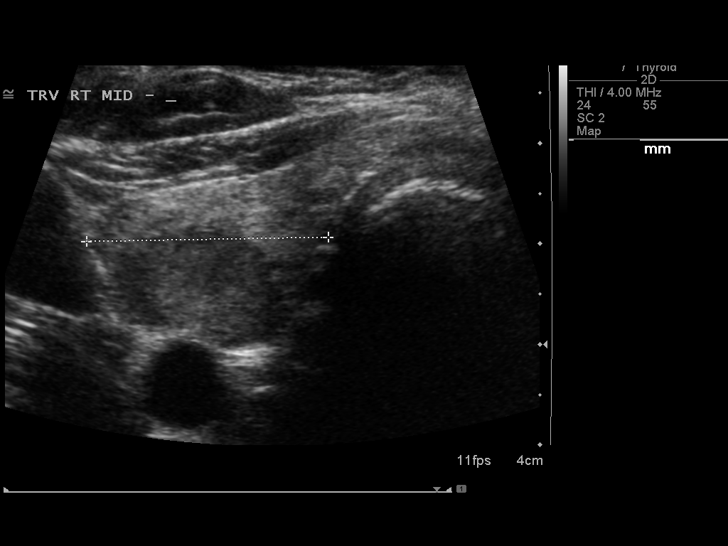
[im 8/47]
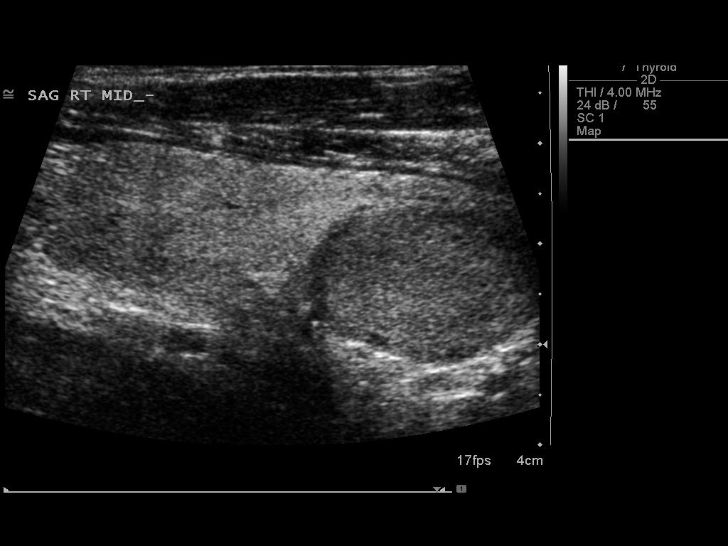
[im 12/47]
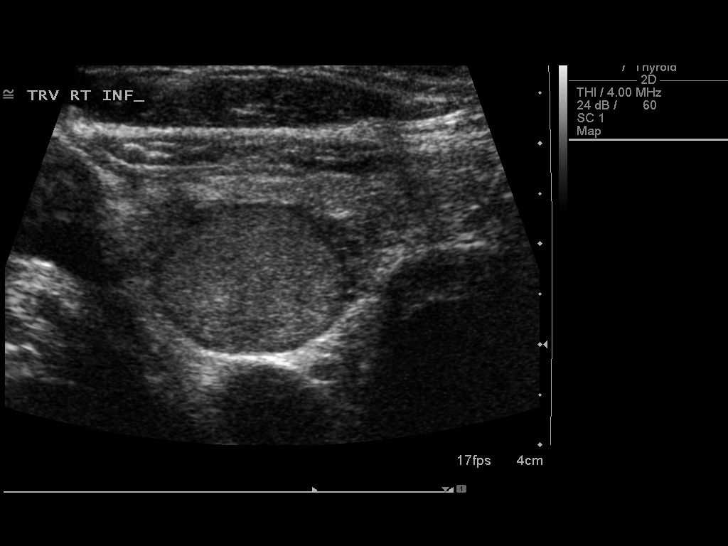
[im 16/47]
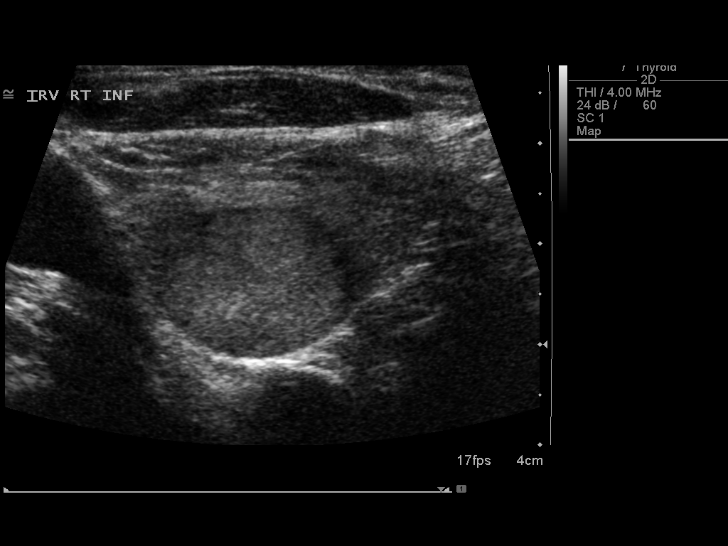
[im 18/47]
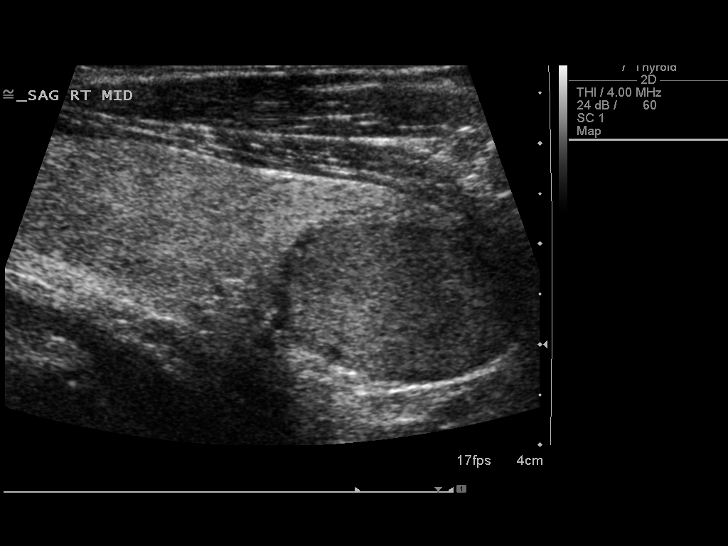
[im 22/47]
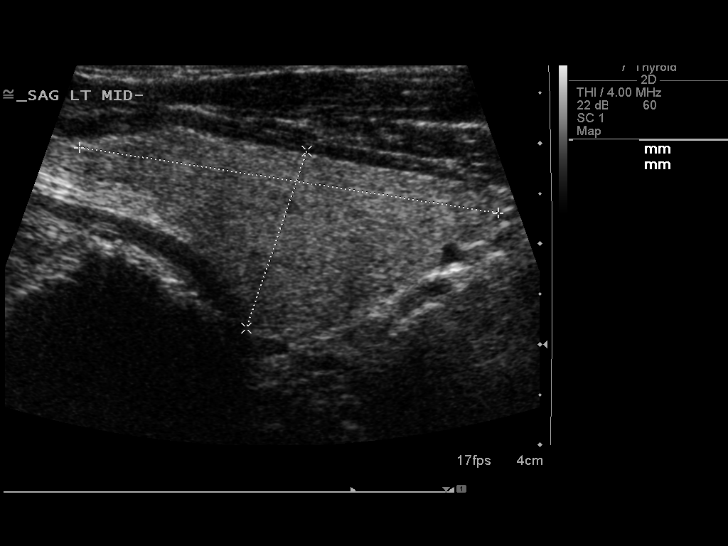
[im 25/47]
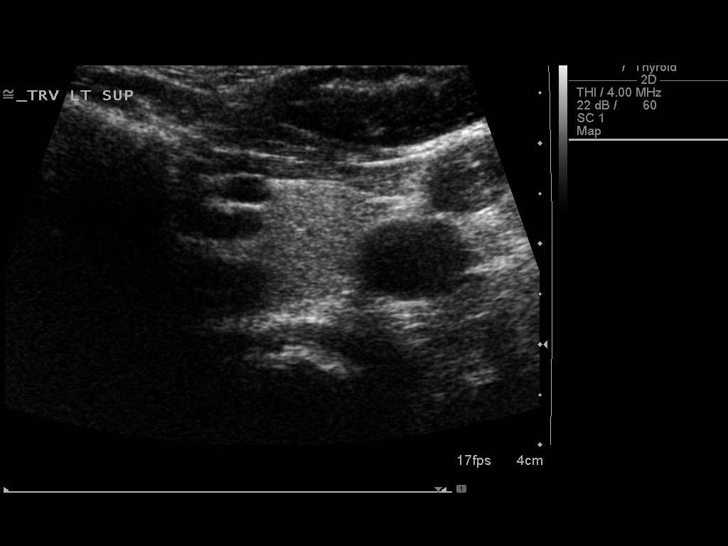
[im 29/47]
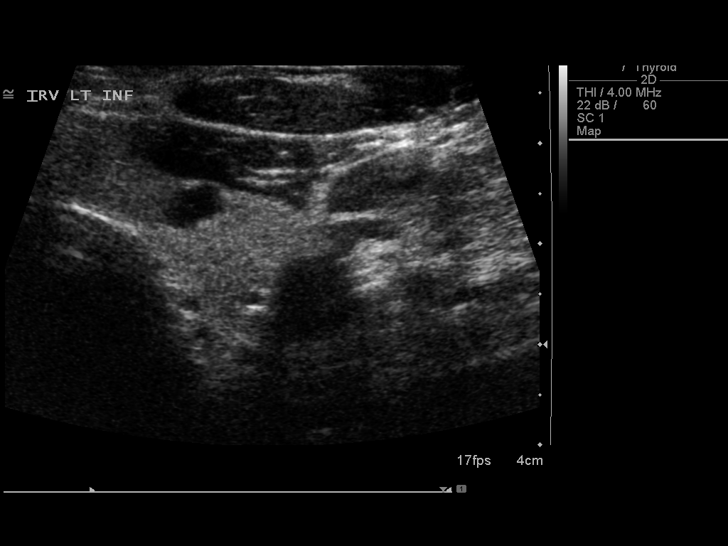
[im 31/47]
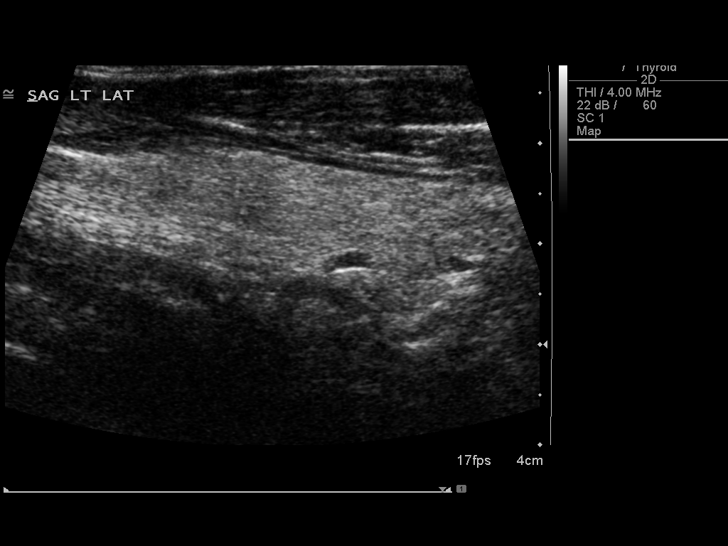
[im 35/47]
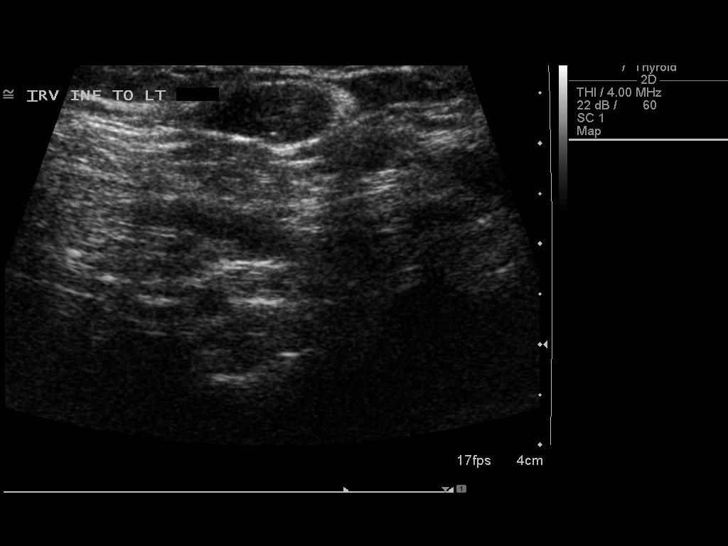
[im 39/47]
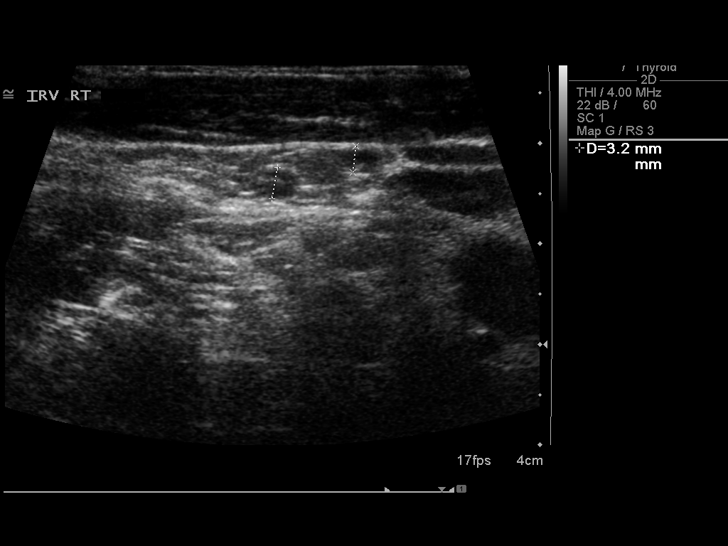
[im 43/47]
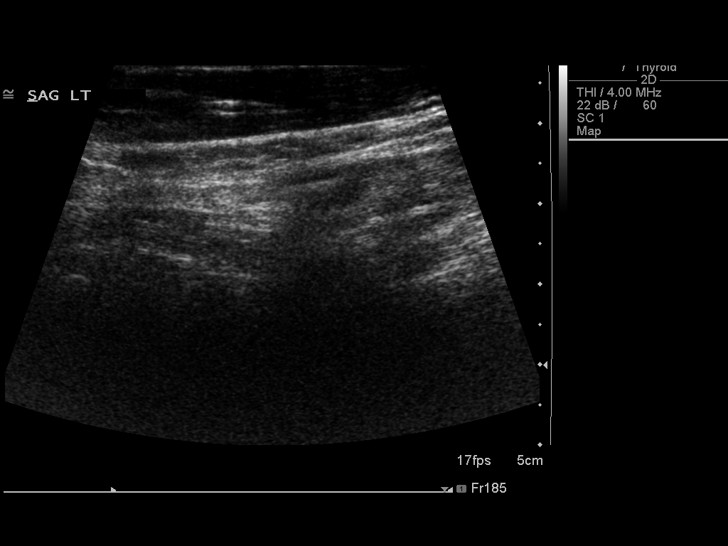
[im 47/47]
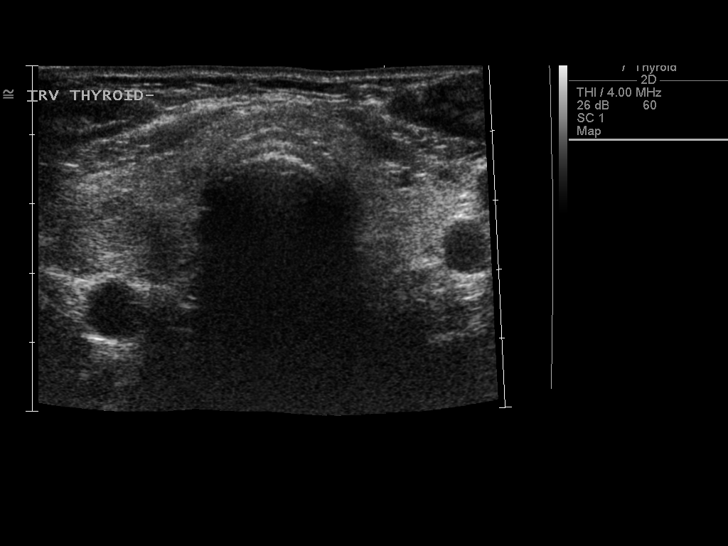

[14 of 25 positions shown; findings below may reference images not displayed]

FINDINGS: Right thyroid lobe

Measurements: 5.5 x 2.0 x 2.4 cm.

1. Slight interval decrease in size of the previously biopsied
hypoechoic solid nodule in the lower pole at 19 x 15 x 24 mm
compared to 22 x 17 x 25 mm previously.
Left thyroid lobe

Measurements: 4.2 x 1.9 x 1.8 cm.  No nodules visualized.

Isthmus

Thickness: 0.5 cm.  No nodules visualized.

Lymphadenopathy

None visualized.
IMPRESSION: Decreasing size of the previously biopsied right lower pole thyroid
nodule. Involution is consistent with benignity.

## 2017-03-24 DIAGNOSIS — J3081 Allergic rhinitis due to animal (cat) (dog) hair and dander: Secondary | ICD-10-CM | POA: Diagnosis not present

## 2017-03-24 DIAGNOSIS — J301 Allergic rhinitis due to pollen: Secondary | ICD-10-CM | POA: Diagnosis not present

## 2017-03-24 DIAGNOSIS — J3089 Other allergic rhinitis: Secondary | ICD-10-CM | POA: Diagnosis not present

## 2017-04-10 DIAGNOSIS — J3089 Other allergic rhinitis: Secondary | ICD-10-CM | POA: Diagnosis not present

## 2017-04-10 DIAGNOSIS — H35033 Hypertensive retinopathy, bilateral: Secondary | ICD-10-CM | POA: Diagnosis not present

## 2017-04-10 DIAGNOSIS — J3081 Allergic rhinitis due to animal (cat) (dog) hair and dander: Secondary | ICD-10-CM | POA: Diagnosis not present

## 2017-04-10 DIAGNOSIS — H34831 Tributary (branch) retinal vein occlusion, right eye, with macular edema: Secondary | ICD-10-CM | POA: Diagnosis not present

## 2017-04-10 DIAGNOSIS — J301 Allergic rhinitis due to pollen: Secondary | ICD-10-CM | POA: Diagnosis not present

## 2017-04-10 DIAGNOSIS — H43813 Vitreous degeneration, bilateral: Secondary | ICD-10-CM | POA: Diagnosis not present

## 2017-04-20 DIAGNOSIS — J301 Allergic rhinitis due to pollen: Secondary | ICD-10-CM | POA: Diagnosis not present

## 2017-04-20 DIAGNOSIS — J3081 Allergic rhinitis due to animal (cat) (dog) hair and dander: Secondary | ICD-10-CM | POA: Diagnosis not present

## 2017-04-20 DIAGNOSIS — J3089 Other allergic rhinitis: Secondary | ICD-10-CM | POA: Diagnosis not present

## 2017-04-24 DIAGNOSIS — J3089 Other allergic rhinitis: Secondary | ICD-10-CM | POA: Diagnosis not present

## 2017-04-24 DIAGNOSIS — J301 Allergic rhinitis due to pollen: Secondary | ICD-10-CM | POA: Diagnosis not present

## 2017-04-24 DIAGNOSIS — J3081 Allergic rhinitis due to animal (cat) (dog) hair and dander: Secondary | ICD-10-CM | POA: Diagnosis not present

## 2017-05-25 DIAGNOSIS — J3089 Other allergic rhinitis: Secondary | ICD-10-CM | POA: Diagnosis not present

## 2017-05-25 DIAGNOSIS — J3081 Allergic rhinitis due to animal (cat) (dog) hair and dander: Secondary | ICD-10-CM | POA: Diagnosis not present

## 2017-05-25 DIAGNOSIS — J301 Allergic rhinitis due to pollen: Secondary | ICD-10-CM | POA: Diagnosis not present

## 2017-05-29 DIAGNOSIS — H35033 Hypertensive retinopathy, bilateral: Secondary | ICD-10-CM | POA: Diagnosis not present

## 2017-05-29 DIAGNOSIS — J3089 Other allergic rhinitis: Secondary | ICD-10-CM | POA: Diagnosis not present

## 2017-05-29 DIAGNOSIS — H35423 Microcystoid degeneration of retina, bilateral: Secondary | ICD-10-CM | POA: Diagnosis not present

## 2017-05-29 DIAGNOSIS — H34831 Tributary (branch) retinal vein occlusion, right eye, with macular edema: Secondary | ICD-10-CM | POA: Diagnosis not present

## 2017-05-29 DIAGNOSIS — J301 Allergic rhinitis due to pollen: Secondary | ICD-10-CM | POA: Diagnosis not present

## 2017-05-29 DIAGNOSIS — J3081 Allergic rhinitis due to animal (cat) (dog) hair and dander: Secondary | ICD-10-CM | POA: Diagnosis not present

## 2017-05-29 DIAGNOSIS — H43813 Vitreous degeneration, bilateral: Secondary | ICD-10-CM | POA: Diagnosis not present

## 2017-05-31 DIAGNOSIS — J3089 Other allergic rhinitis: Secondary | ICD-10-CM | POA: Diagnosis not present

## 2017-05-31 DIAGNOSIS — J3081 Allergic rhinitis due to animal (cat) (dog) hair and dander: Secondary | ICD-10-CM | POA: Diagnosis not present

## 2017-05-31 DIAGNOSIS — J301 Allergic rhinitis due to pollen: Secondary | ICD-10-CM | POA: Diagnosis not present

## 2017-06-05 DIAGNOSIS — J3089 Other allergic rhinitis: Secondary | ICD-10-CM | POA: Diagnosis not present

## 2017-06-05 DIAGNOSIS — J3081 Allergic rhinitis due to animal (cat) (dog) hair and dander: Secondary | ICD-10-CM | POA: Diagnosis not present

## 2017-06-05 DIAGNOSIS — J301 Allergic rhinitis due to pollen: Secondary | ICD-10-CM | POA: Diagnosis not present

## 2017-06-07 DIAGNOSIS — J301 Allergic rhinitis due to pollen: Secondary | ICD-10-CM | POA: Diagnosis not present

## 2017-06-07 DIAGNOSIS — J3081 Allergic rhinitis due to animal (cat) (dog) hair and dander: Secondary | ICD-10-CM | POA: Diagnosis not present

## 2017-06-07 DIAGNOSIS — J3089 Other allergic rhinitis: Secondary | ICD-10-CM | POA: Diagnosis not present

## 2017-07-03 DIAGNOSIS — J3081 Allergic rhinitis due to animal (cat) (dog) hair and dander: Secondary | ICD-10-CM | POA: Diagnosis not present

## 2017-07-03 DIAGNOSIS — J301 Allergic rhinitis due to pollen: Secondary | ICD-10-CM | POA: Diagnosis not present

## 2017-07-24 DIAGNOSIS — H43813 Vitreous degeneration, bilateral: Secondary | ICD-10-CM | POA: Diagnosis not present

## 2017-07-24 DIAGNOSIS — H34831 Tributary (branch) retinal vein occlusion, right eye, with macular edema: Secondary | ICD-10-CM | POA: Diagnosis not present

## 2017-07-24 DIAGNOSIS — H35033 Hypertensive retinopathy, bilateral: Secondary | ICD-10-CM | POA: Diagnosis not present

## 2017-07-31 DIAGNOSIS — R062 Wheezing: Secondary | ICD-10-CM | POA: Diagnosis not present

## 2017-07-31 DIAGNOSIS — J3089 Other allergic rhinitis: Secondary | ICD-10-CM | POA: Diagnosis not present

## 2017-07-31 DIAGNOSIS — J3081 Allergic rhinitis due to animal (cat) (dog) hair and dander: Secondary | ICD-10-CM | POA: Diagnosis not present

## 2017-08-07 DIAGNOSIS — J301 Allergic rhinitis due to pollen: Secondary | ICD-10-CM | POA: Diagnosis not present

## 2017-08-07 DIAGNOSIS — J3081 Allergic rhinitis due to animal (cat) (dog) hair and dander: Secondary | ICD-10-CM | POA: Diagnosis not present

## 2017-08-21 DIAGNOSIS — L821 Other seborrheic keratosis: Secondary | ICD-10-CM | POA: Diagnosis not present

## 2017-08-21 DIAGNOSIS — L820 Inflamed seborrheic keratosis: Secondary | ICD-10-CM | POA: Diagnosis not present

## 2017-08-21 DIAGNOSIS — L570 Actinic keratosis: Secondary | ICD-10-CM | POA: Diagnosis not present

## 2017-08-21 DIAGNOSIS — Z85828 Personal history of other malignant neoplasm of skin: Secondary | ICD-10-CM | POA: Diagnosis not present

## 2017-08-22 DIAGNOSIS — Z0000 Encounter for general adult medical examination without abnormal findings: Secondary | ICD-10-CM | POA: Diagnosis not present

## 2017-08-22 DIAGNOSIS — Z13220 Encounter for screening for lipoid disorders: Secondary | ICD-10-CM | POA: Diagnosis not present

## 2017-09-04 DIAGNOSIS — J3089 Other allergic rhinitis: Secondary | ICD-10-CM | POA: Diagnosis not present

## 2017-09-04 DIAGNOSIS — J301 Allergic rhinitis due to pollen: Secondary | ICD-10-CM | POA: Diagnosis not present

## 2017-09-04 DIAGNOSIS — J3081 Allergic rhinitis due to animal (cat) (dog) hair and dander: Secondary | ICD-10-CM | POA: Diagnosis not present

## 2017-09-18 DIAGNOSIS — H34831 Tributary (branch) retinal vein occlusion, right eye, with macular edema: Secondary | ICD-10-CM | POA: Diagnosis not present

## 2017-09-18 DIAGNOSIS — H35423 Microcystoid degeneration of retina, bilateral: Secondary | ICD-10-CM | POA: Diagnosis not present

## 2017-09-18 DIAGNOSIS — H35033 Hypertensive retinopathy, bilateral: Secondary | ICD-10-CM | POA: Diagnosis not present

## 2017-10-02 DIAGNOSIS — J3089 Other allergic rhinitis: Secondary | ICD-10-CM | POA: Diagnosis not present

## 2017-10-02 DIAGNOSIS — J301 Allergic rhinitis due to pollen: Secondary | ICD-10-CM | POA: Diagnosis not present

## 2017-10-02 DIAGNOSIS — J3081 Allergic rhinitis due to animal (cat) (dog) hair and dander: Secondary | ICD-10-CM | POA: Diagnosis not present

## 2017-11-06 DIAGNOSIS — J301 Allergic rhinitis due to pollen: Secondary | ICD-10-CM | POA: Diagnosis not present

## 2017-11-06 DIAGNOSIS — J3089 Other allergic rhinitis: Secondary | ICD-10-CM | POA: Diagnosis not present

## 2017-11-06 DIAGNOSIS — J3081 Allergic rhinitis due to animal (cat) (dog) hair and dander: Secondary | ICD-10-CM | POA: Diagnosis not present

## 2017-11-13 DIAGNOSIS — H35423 Microcystoid degeneration of retina, bilateral: Secondary | ICD-10-CM | POA: Diagnosis not present

## 2017-11-13 DIAGNOSIS — H34831 Tributary (branch) retinal vein occlusion, right eye, with macular edema: Secondary | ICD-10-CM | POA: Diagnosis not present

## 2017-11-13 DIAGNOSIS — H35033 Hypertensive retinopathy, bilateral: Secondary | ICD-10-CM | POA: Diagnosis not present

## 2017-12-04 DIAGNOSIS — J301 Allergic rhinitis due to pollen: Secondary | ICD-10-CM | POA: Diagnosis not present

## 2017-12-04 DIAGNOSIS — J3089 Other allergic rhinitis: Secondary | ICD-10-CM | POA: Diagnosis not present

## 2017-12-04 DIAGNOSIS — J3081 Allergic rhinitis due to animal (cat) (dog) hair and dander: Secondary | ICD-10-CM | POA: Diagnosis not present

## 2018-01-08 DIAGNOSIS — J3089 Other allergic rhinitis: Secondary | ICD-10-CM | POA: Diagnosis not present

## 2018-01-08 DIAGNOSIS — J3081 Allergic rhinitis due to animal (cat) (dog) hair and dander: Secondary | ICD-10-CM | POA: Diagnosis not present

## 2018-01-08 DIAGNOSIS — J301 Allergic rhinitis due to pollen: Secondary | ICD-10-CM | POA: Diagnosis not present

## 2018-02-05 DIAGNOSIS — H34831 Tributary (branch) retinal vein occlusion, right eye, with macular edema: Secondary | ICD-10-CM | POA: Diagnosis not present

## 2018-02-05 DIAGNOSIS — J3089 Other allergic rhinitis: Secondary | ICD-10-CM | POA: Diagnosis not present

## 2018-02-05 DIAGNOSIS — J3081 Allergic rhinitis due to animal (cat) (dog) hair and dander: Secondary | ICD-10-CM | POA: Diagnosis not present

## 2018-02-05 DIAGNOSIS — J301 Allergic rhinitis due to pollen: Secondary | ICD-10-CM | POA: Diagnosis not present

## 2018-02-05 DIAGNOSIS — H43813 Vitreous degeneration, bilateral: Secondary | ICD-10-CM | POA: Diagnosis not present

## 2018-02-05 DIAGNOSIS — H35033 Hypertensive retinopathy, bilateral: Secondary | ICD-10-CM | POA: Diagnosis not present

## 2018-03-09 DIAGNOSIS — J301 Allergic rhinitis due to pollen: Secondary | ICD-10-CM | POA: Diagnosis not present

## 2018-03-09 DIAGNOSIS — J3081 Allergic rhinitis due to animal (cat) (dog) hair and dander: Secondary | ICD-10-CM | POA: Diagnosis not present

## 2018-03-09 DIAGNOSIS — J3089 Other allergic rhinitis: Secondary | ICD-10-CM | POA: Diagnosis not present

## 2018-04-02 DIAGNOSIS — J3081 Allergic rhinitis due to animal (cat) (dog) hair and dander: Secondary | ICD-10-CM | POA: Diagnosis not present

## 2018-04-02 DIAGNOSIS — J3089 Other allergic rhinitis: Secondary | ICD-10-CM | POA: Diagnosis not present

## 2018-04-02 DIAGNOSIS — J301 Allergic rhinitis due to pollen: Secondary | ICD-10-CM | POA: Diagnosis not present

## 2018-04-27 DIAGNOSIS — H34831 Tributary (branch) retinal vein occlusion, right eye, with macular edema: Secondary | ICD-10-CM | POA: Diagnosis not present

## 2018-04-30 DIAGNOSIS — H43813 Vitreous degeneration, bilateral: Secondary | ICD-10-CM | POA: Diagnosis not present

## 2018-04-30 DIAGNOSIS — H34831 Tributary (branch) retinal vein occlusion, right eye, with macular edema: Secondary | ICD-10-CM | POA: Diagnosis not present

## 2018-05-11 DIAGNOSIS — J3089 Other allergic rhinitis: Secondary | ICD-10-CM | POA: Diagnosis not present

## 2018-05-11 DIAGNOSIS — J3081 Allergic rhinitis due to animal (cat) (dog) hair and dander: Secondary | ICD-10-CM | POA: Diagnosis not present

## 2018-05-11 DIAGNOSIS — J301 Allergic rhinitis due to pollen: Secondary | ICD-10-CM | POA: Diagnosis not present

## 2018-05-15 DIAGNOSIS — J3089 Other allergic rhinitis: Secondary | ICD-10-CM | POA: Diagnosis not present

## 2018-05-15 DIAGNOSIS — J301 Allergic rhinitis due to pollen: Secondary | ICD-10-CM | POA: Diagnosis not present

## 2018-05-15 DIAGNOSIS — J3081 Allergic rhinitis due to animal (cat) (dog) hair and dander: Secondary | ICD-10-CM | POA: Diagnosis not present

## 2018-06-15 DIAGNOSIS — H01114 Allergic dermatitis of left upper eyelid: Secondary | ICD-10-CM | POA: Diagnosis not present
